# Patient Record
Sex: Female | Born: 1945 | Race: Black or African American | Hispanic: No | Smoking: Former smoker
Health system: Southern US, Community
[De-identification: ages and names within clinical notes are randomized; demographics above are authoritative.]

## PROBLEM LIST (undated history)

## (undated) DIAGNOSIS — E7800 Pure hypercholesterolemia, unspecified: Secondary | ICD-10-CM

## (undated) DIAGNOSIS — I219 Acute myocardial infarction, unspecified: Secondary | ICD-10-CM

## (undated) DIAGNOSIS — I10 Essential (primary) hypertension: Secondary | ICD-10-CM

## (undated) DIAGNOSIS — E119 Type 2 diabetes mellitus without complications: Secondary | ICD-10-CM

## (undated) HISTORY — PX: BACK SURGERY: SHX140

## (undated) HISTORY — DX: Pure hypercholesterolemia, unspecified: E78.00

## (undated) HISTORY — DX: Essential (primary) hypertension: I10

## (undated) HISTORY — PX: ABDOMINAL HYSTERECTOMY: SHX81

## (undated) HISTORY — PX: NECK SURGERY: SHX720

## (undated) HISTORY — DX: Acute myocardial infarction, unspecified: I21.9

## (undated) HISTORY — DX: Type 2 diabetes mellitus without complications: E11.9

---

## 1998-12-25 ENCOUNTER — Encounter: Payer: Self-pay | Admitting: Neurosurgery

## 1998-12-25 ENCOUNTER — Ambulatory Visit (HOSPITAL_COMMUNITY): Admission: RE | Admit: 1998-12-25 | Discharge: 1998-12-25 | Payer: Self-pay | Admitting: Neurosurgery

## 1999-02-19 ENCOUNTER — Encounter: Admission: RE | Admit: 1999-02-19 | Discharge: 1999-03-16 | Payer: Self-pay | Admitting: Anesthesiology

## 2001-07-03 ENCOUNTER — Ambulatory Visit (HOSPITAL_COMMUNITY): Admission: RE | Admit: 2001-07-03 | Discharge: 2001-07-03 | Payer: Self-pay | Admitting: *Deleted

## 2001-07-03 ENCOUNTER — Encounter: Payer: Self-pay | Admitting: *Deleted

## 2001-07-10 ENCOUNTER — Encounter: Payer: Self-pay | Admitting: *Deleted

## 2001-07-10 ENCOUNTER — Ambulatory Visit (HOSPITAL_COMMUNITY): Admission: RE | Admit: 2001-07-10 | Discharge: 2001-07-10 | Payer: Self-pay | Admitting: *Deleted

## 2002-01-06 ENCOUNTER — Encounter (HOSPITAL_COMMUNITY): Admission: RE | Admit: 2002-01-06 | Discharge: 2002-02-05 | Payer: Self-pay | Admitting: Cardiology

## 2002-01-06 ENCOUNTER — Encounter: Payer: Self-pay | Admitting: Cardiology

## 2002-02-12 ENCOUNTER — Ambulatory Visit (HOSPITAL_COMMUNITY): Admission: RE | Admit: 2002-02-12 | Discharge: 2002-02-12 | Payer: Self-pay | Admitting: Internal Medicine

## 2002-03-11 HISTORY — PX: CARDIAC CATHETERIZATION: SHX172

## 2002-08-24 ENCOUNTER — Encounter: Payer: Self-pay | Admitting: *Deleted

## 2002-08-24 ENCOUNTER — Ambulatory Visit (HOSPITAL_COMMUNITY): Admission: RE | Admit: 2002-08-24 | Discharge: 2002-08-24 | Payer: Self-pay | Admitting: *Deleted

## 2002-09-08 ENCOUNTER — Ambulatory Visit (HOSPITAL_COMMUNITY): Admission: RE | Admit: 2002-09-08 | Discharge: 2002-09-08 | Payer: Self-pay | Admitting: *Deleted

## 2002-09-21 ENCOUNTER — Encounter: Payer: Self-pay | Admitting: *Deleted

## 2002-09-21 ENCOUNTER — Ambulatory Visit (HOSPITAL_COMMUNITY): Admission: RE | Admit: 2002-09-21 | Discharge: 2002-09-21 | Payer: Self-pay | Admitting: *Deleted

## 2007-01-25 ENCOUNTER — Emergency Department (HOSPITAL_COMMUNITY): Admission: EM | Admit: 2007-01-25 | Discharge: 2007-01-25 | Payer: Self-pay | Admitting: Emergency Medicine

## 2007-05-31 ENCOUNTER — Inpatient Hospital Stay (HOSPITAL_COMMUNITY): Admission: EM | Admit: 2007-05-31 | Discharge: 2007-06-02 | Payer: Self-pay | Admitting: Emergency Medicine

## 2007-05-31 ENCOUNTER — Ambulatory Visit: Payer: Self-pay | Admitting: Internal Medicine

## 2007-06-18 ENCOUNTER — Ambulatory Visit: Payer: Self-pay | Admitting: *Deleted

## 2009-02-08 ENCOUNTER — Encounter: Payer: Self-pay | Admitting: Gastroenterology

## 2009-02-08 ENCOUNTER — Telehealth (INDEPENDENT_AMBULATORY_CARE_PROVIDER_SITE_OTHER): Payer: Self-pay | Admitting: *Deleted

## 2009-02-10 ENCOUNTER — Encounter: Payer: Self-pay | Admitting: Gastroenterology

## 2009-02-14 ENCOUNTER — Ambulatory Visit: Payer: Self-pay | Admitting: Gastroenterology

## 2009-02-14 ENCOUNTER — Ambulatory Visit (HOSPITAL_COMMUNITY): Admission: RE | Admit: 2009-02-14 | Discharge: 2009-02-14 | Payer: Self-pay | Admitting: Gastroenterology

## 2009-02-14 HISTORY — PX: COLONOSCOPY: SHX174

## 2009-02-27 ENCOUNTER — Encounter: Payer: Self-pay | Admitting: Gastroenterology

## 2009-05-07 IMAGING — CR DG CHEST 2V
2 series · 2 of 2 positions shown · non-contrast
Comparison: 01/25/2007

CLINICAL DATA: Chest pain for several months.   Some pain in the left arm. 
 CHEST - 2 VIEW:

[view not recorded (1 of 2)]
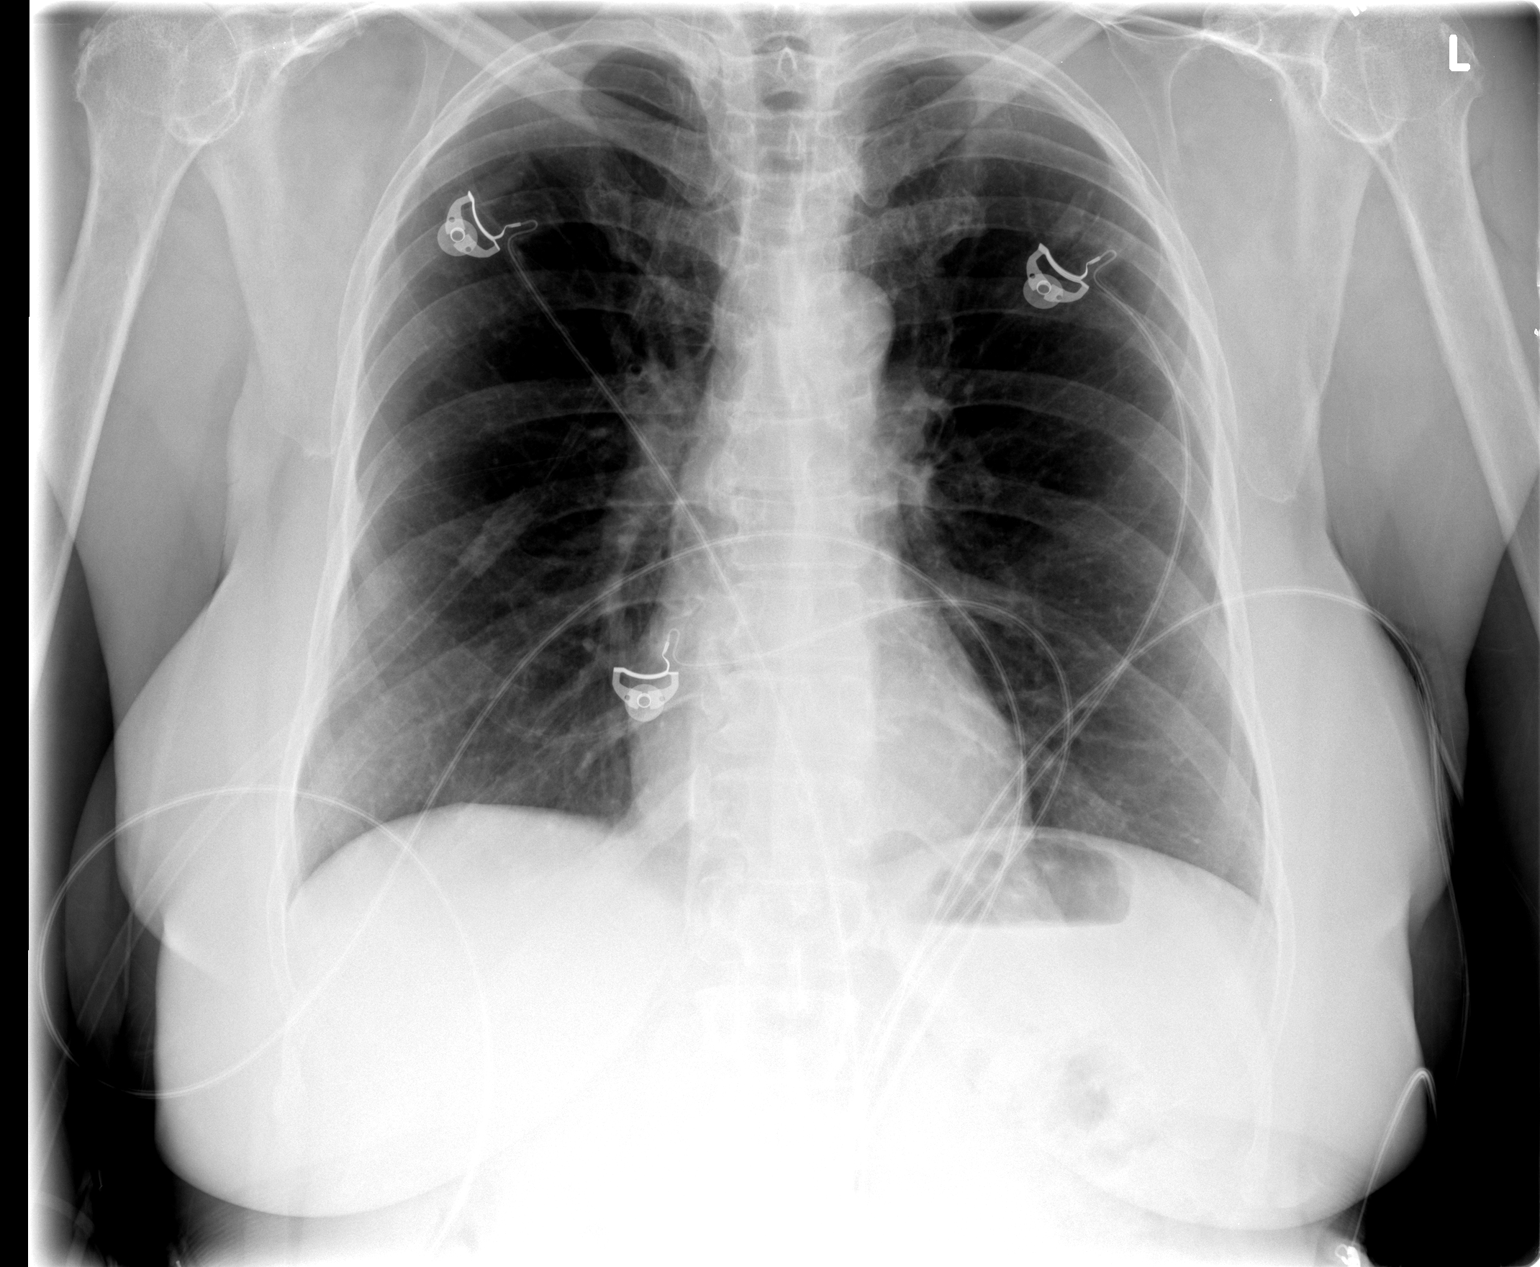

[view not recorded (2 of 2)]
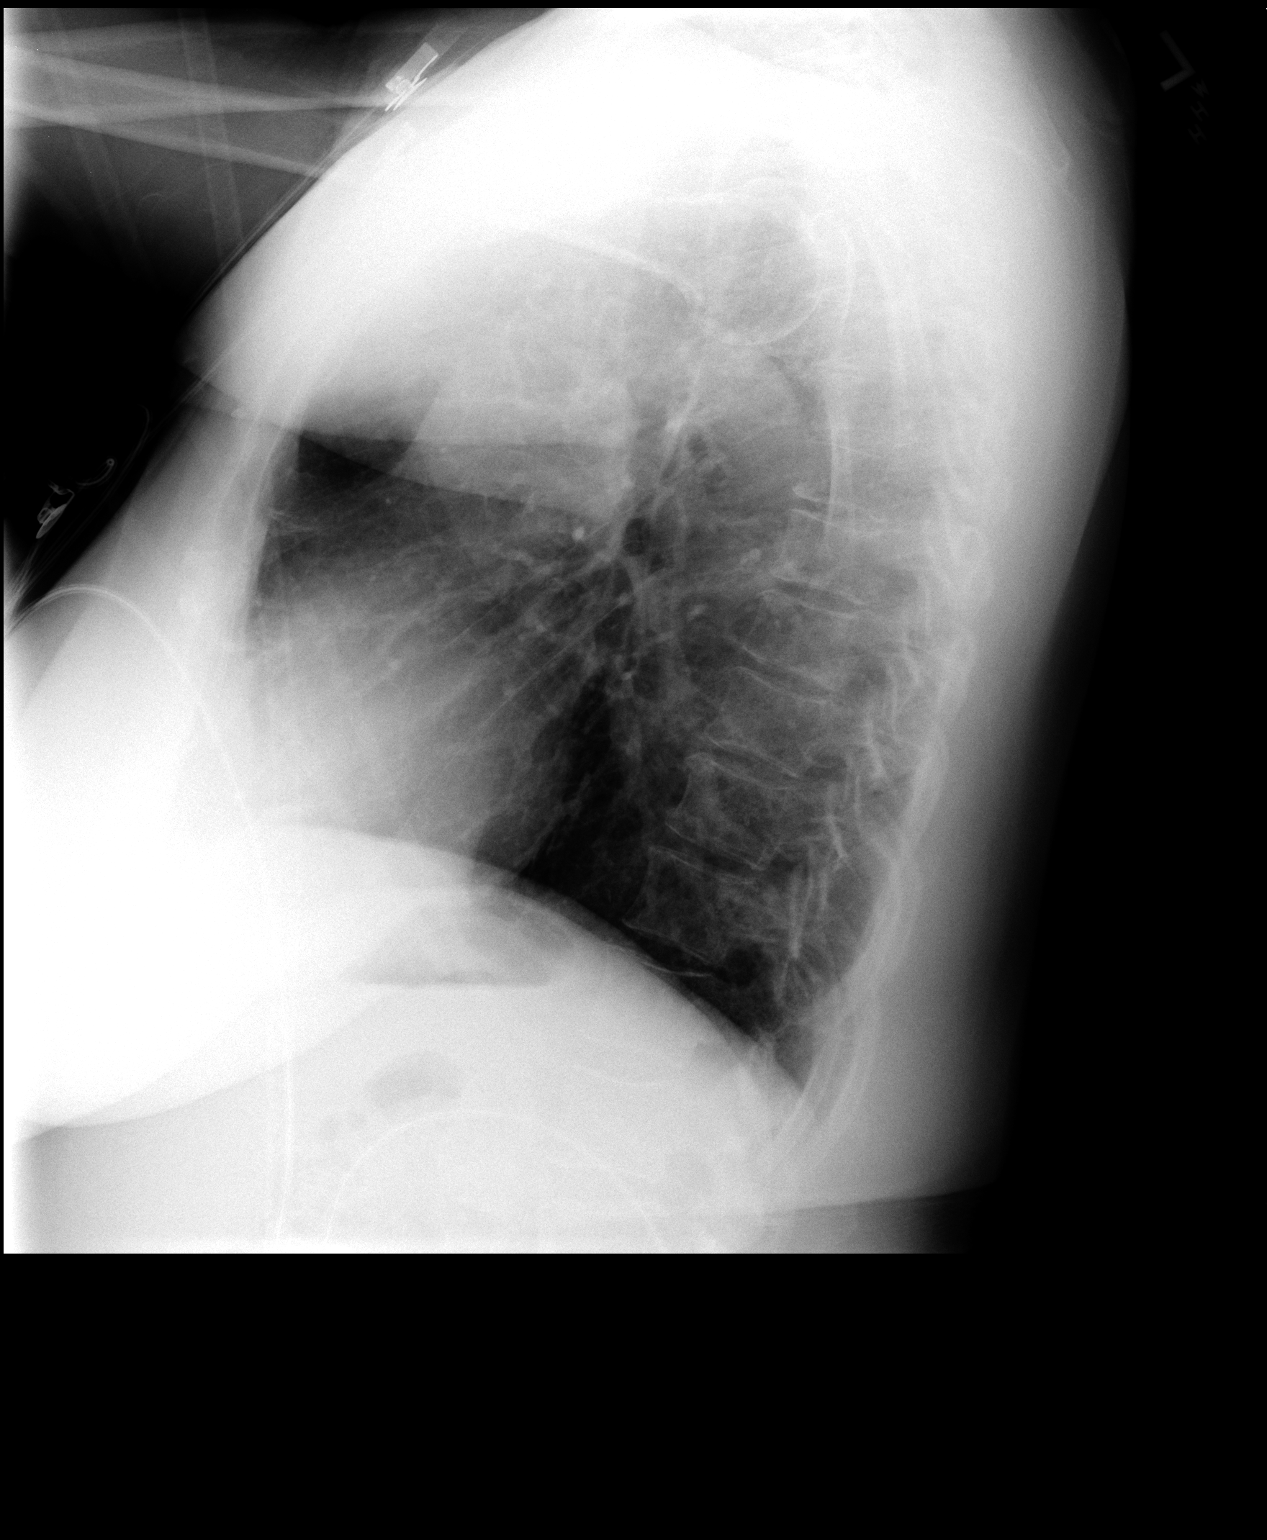

[2 of 2 positions shown; findings below may reference images not displayed]

FINDINGS: Two views of the chest show the lungs to be clear and hyperaerated.  The heart is within normal limits in size. No bony abnormality is seen.
IMPRESSION: Stable chest x-ray.  No active lung disease.

## 2009-05-08 IMAGING — US US EXTREM LOW VENOUS*L*
1 series · 14 of 24 positions shown · non-contrast
Comparison: None

CLINICAL DATA: Pain; ;



[Series 1: unknown · 14 of 26 slices shown]
[im 1/26]
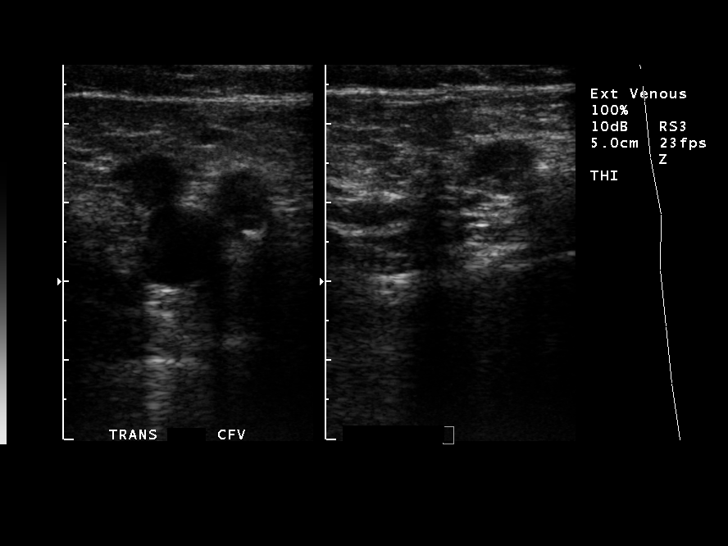
[im 3/26]
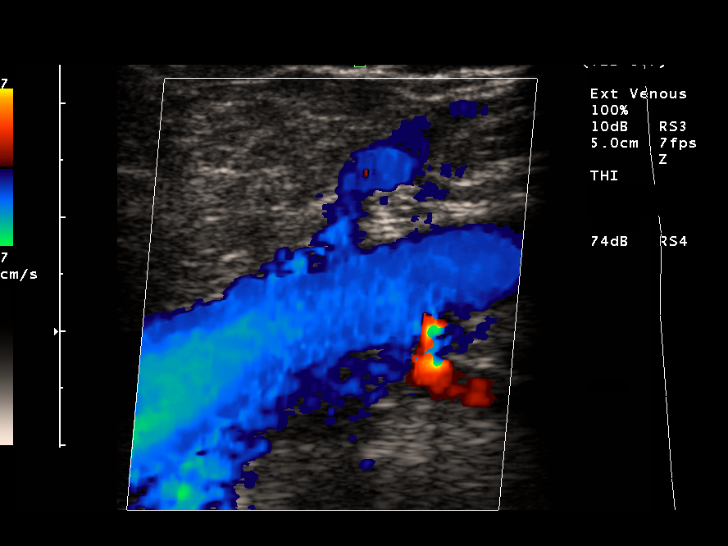
[im 5/26]
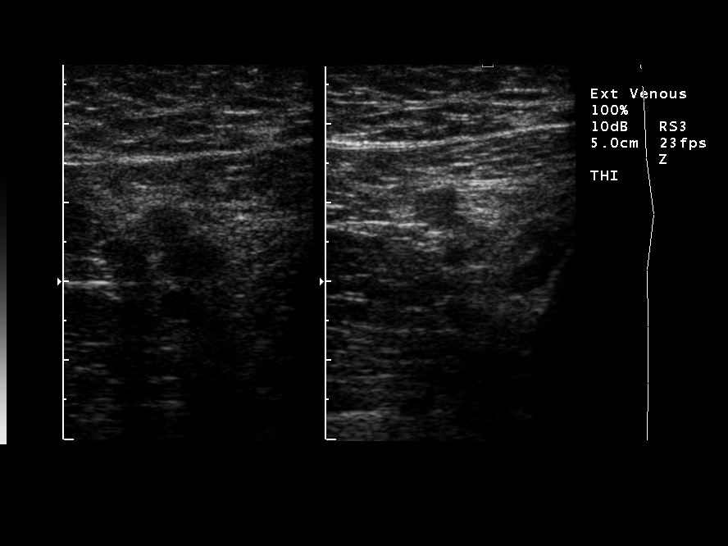
[im 7/26]
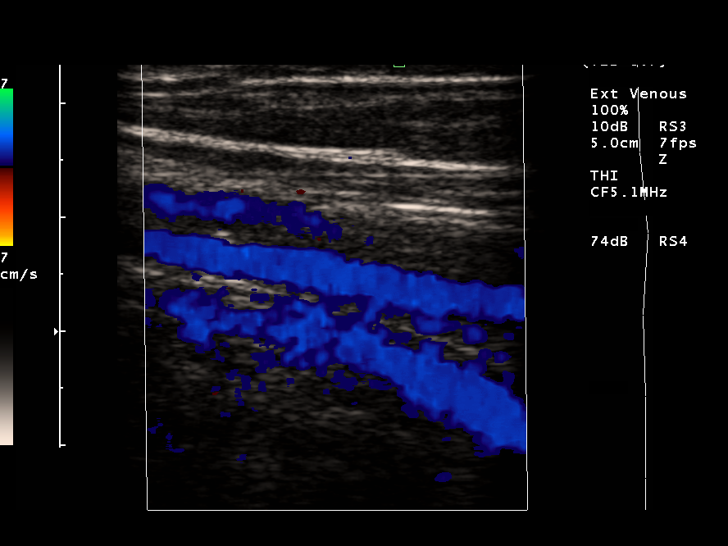
[im 8/26]
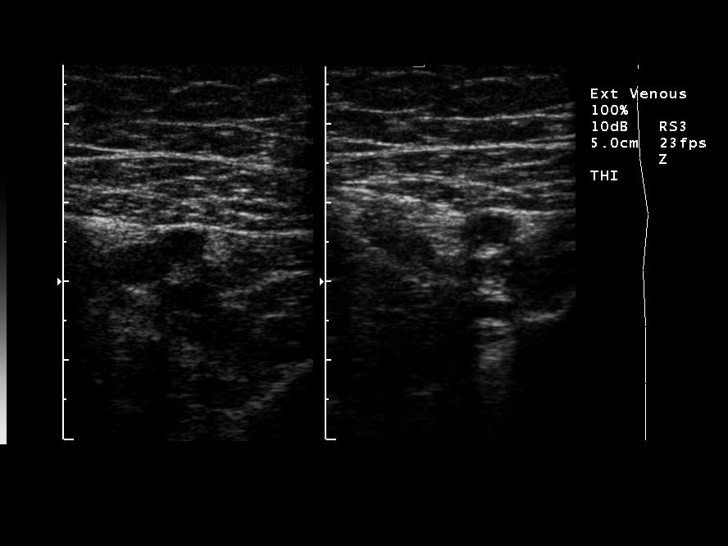
[im 10/26]
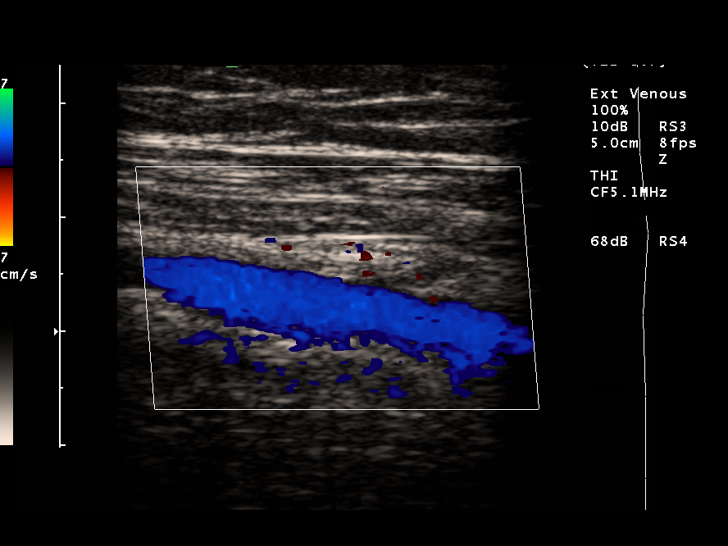
[im 12/26]
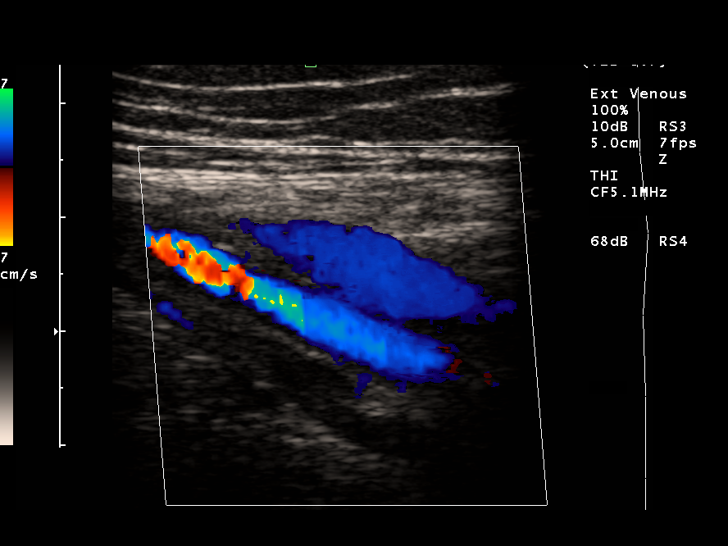
[im 14/26]
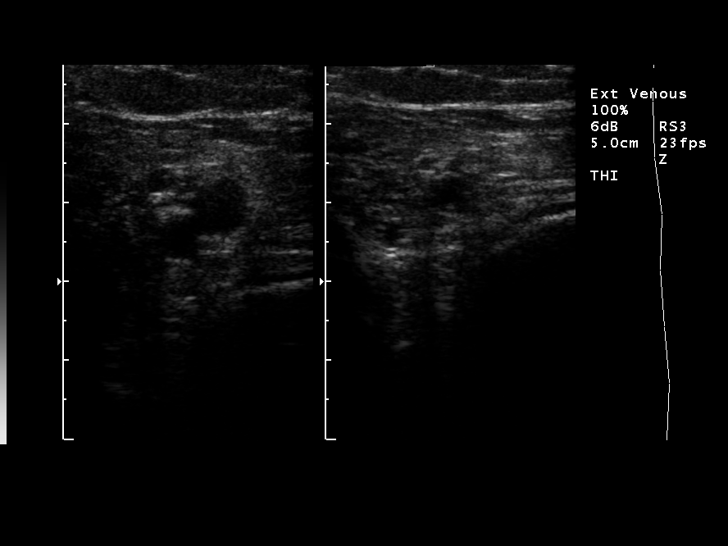
[im 16/26]
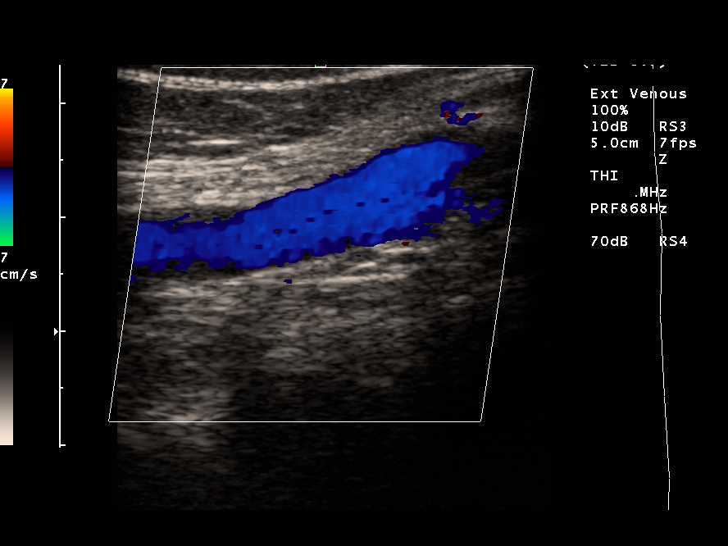
[im 18/26]
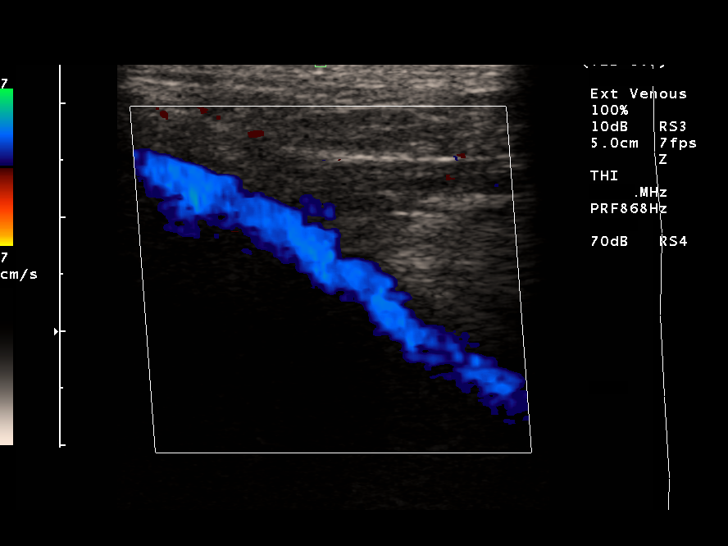
[im 20/26]
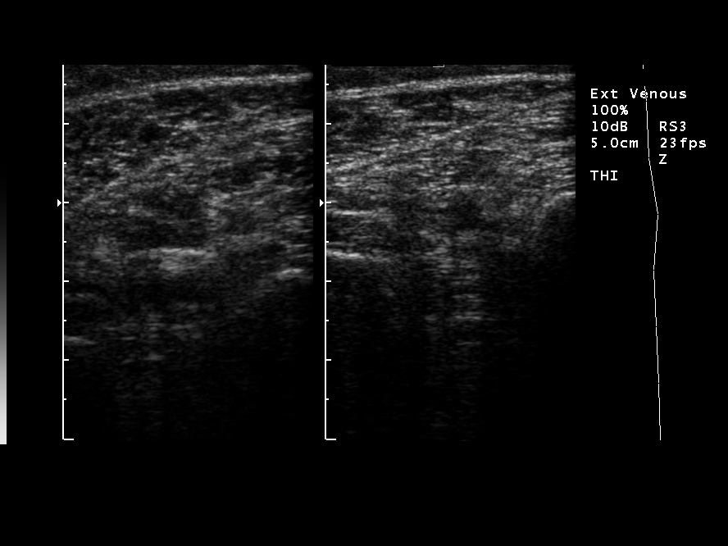
[im 21/26]
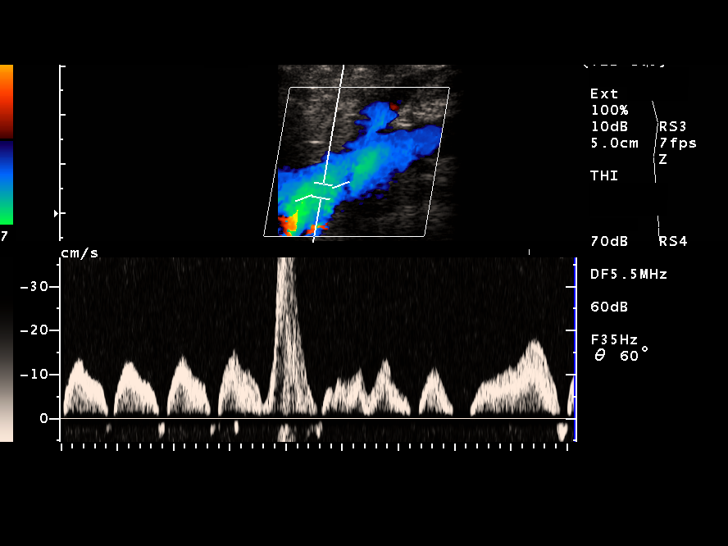
[im 23/26]
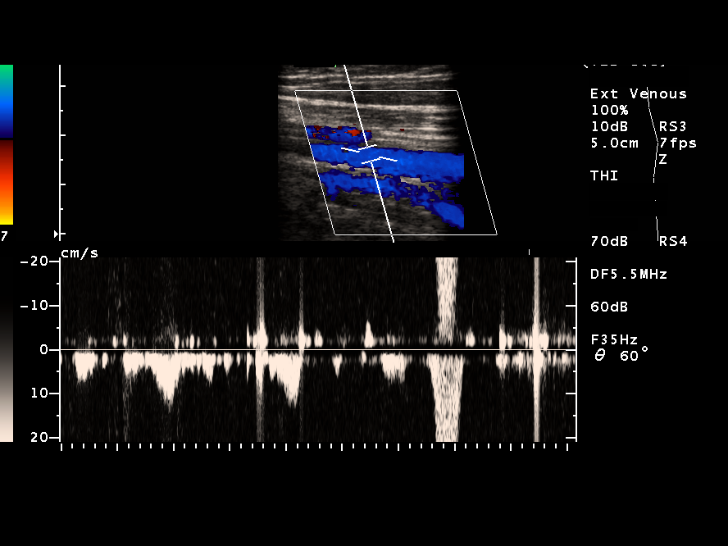
[im 26/26]
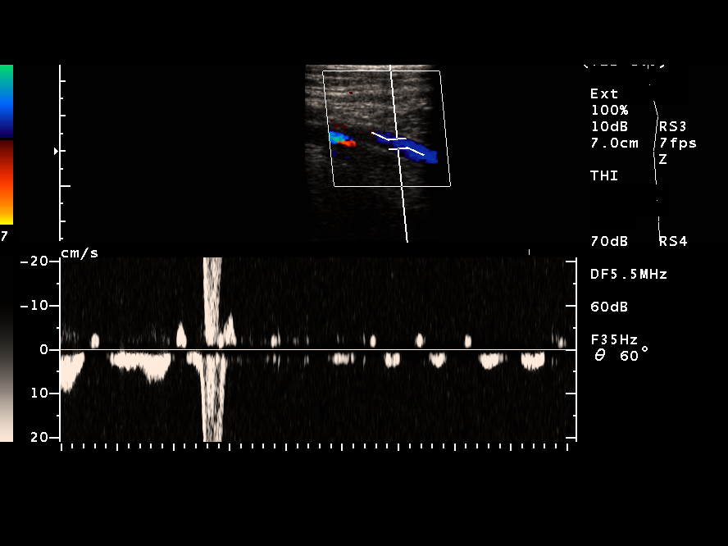

[14 of 24 positions shown; findings below may reference images not displayed]

FINDINGS: Deep venous system patent and compressible from the groin through
popliteal fossa.
Spontaneous venous flow is and with intact augmentation.
No intraluminal thrombus identified.
Venous flow was pulsatile suggesting elevated right heart
pressures..
IMPRESSION: No evidence of deep venous thrombosis.
Question elevated right heart pressure.

## 2010-03-03 ENCOUNTER — Emergency Department (HOSPITAL_COMMUNITY)
Admission: EM | Admit: 2010-03-03 | Discharge: 2010-03-03 | Payer: Self-pay | Source: Home / Self Care | Admitting: Emergency Medicine

## 2010-04-12 NOTE — Letter (Signed)
Summary: Internal Other Montey Hora change for TCS  Internal Other /Prep change for TCS   Imported By: Cloria Spring LPN 84/13/2440 10:27:25  _____________________________________________________________________  External Attachment:    Type:   Image     Comment:   External Document

## 2010-04-12 NOTE — Progress Notes (Signed)
Summary: changing prep  Phone Note Call from Patient   Summary of Call: [name] is scheduled for a TCS on 02/14/09 and she is worried about the prep Rx that was sent to [facility]) costing her $42.00.  She wanted to know if SF could change it to something more affortable for her. You can reach her at [phone] Initial call taken by: Diana Eves,  February 08, 2009 2:14 PM     Appended Document: changing prep Please send Rx for TRILYTE to pharmacy and send pt UTD instructions.  Appended Document: changing prep Pt informed. Called and spoke to [facility], told her not to fill the [facility], and faxed new Rx for [name]. Pt to come by tomorrow, or send someone to get her instructions for her.

## 2010-04-13 NOTE — Letter (Signed)
Summary: Internal Other Domingo Dimes  Internal Other Domingo Dimes   Imported By: Cloria Spring LPN 16/12/9602 54:09:81  _____________________________________________________________________  External Attachment:    Type:   Image     Comment:   External Document

## 2010-05-21 LAB — GLUCOSE, CAPILLARY: Glucose-Capillary: 148 mg/dL — ABNORMAL HIGH (ref 70–99)

## 2010-06-12 LAB — GLUCOSE, CAPILLARY: Glucose-Capillary: 100 mg/dL — ABNORMAL HIGH (ref 70–99)

## 2010-07-24 NOTE — Discharge Summary (Signed)
NAMEKRUTI, HORACEK NO.:  [id number]   MEDICAL RECORD NO.:  [id number]          PATIENT TYPE:  INP   LOCATION:  A213                          FACILITY:  [facility]   PHYSICIAN:  Skeet Latch, DO    DATE OF BIRTH:  [birth year]   DATE OF ADMISSION:  05/31/2007  DATE OF DISCHARGE:  03/24/2009LH                               DISCHARGE SUMMARY   ADDENDUM   Levaquin 250 mg daily for 5 days to her medications.      Skeet Latch, DO  Electronically Signed     SM/MEDQ  D:  06/02/2007  T:  06/03/2007  Job:  [id number]

## 2010-07-24 NOTE — Consult Note (Signed)
VASCULAR SURGERY CONSULTATION   [name], [name]  DOB:  [birth year]                                       06/18/2007  ZOXWR#:[id number]   REASON FOR REFERRAL:  Bilateral leg pain.   Patient is a 65 year old African-American female seen in the office  today with complaints of bilateral leg pain.  This is located in the  posterior thigh, more severe in the left than the right leg.  Typically  occurs in the morning and is not related to activity.  Usually  disappears by the afternoon.   She describes this pain to be worsening since undergoing neck surgery in  1996 and back surgery in 1997 by Dr. Jeral Fruit.   She has taken multiple medications to aid with this pain, including  tramadol, Aleve, and Percocet.   Patient denies calf discomfort with ambulation or foot pain.  No history  of nonhealing lesions.   Risk factors for peripheral vascular disease include a history of  tobacco use, type 2 diabetes, hypertension, and family history.   PAST MEDICAL HISTORY:  1. Hypertension.  2. Type 2 diabetes.  3. Cystitis.  4. Hyperlipidemia.   MEDICATIONS:  1. Glipizide 5 mg daily.  2. Glyburide/Metformin 5/500 1 tablet daily.  3. Metoprolol 100 mg daily.  4. Simvastatin 80 mg daily.  5. Tramadol 50 mg p.r.n.   ALLERGIES:  None known.   SOCIAL HISTORY:  Patient is widowed with one child.  She is on long-term  disability.  Does not currently smoke.  Had smoked up to two packs of  cigarettes daily until November of least year.  No regular alcohol use.   FAMILY HISTORY:  Mother died at age 82 with a history of heart disease  and vascular disease.  She had undergone a lower extremity amputation.  Father died in his 47s with a history of lung CA and heart disease.   REVIEW OF SYSTEMS:  This is reviewed today per patient encounter form.  Patient denies general, cardiac, pulmonary, GI/GU, neurologic,  psychiatric, bleeding, hearing, or visual problems.  She does  note  chronic pain in the legs, as per HPI.  Also arthritic joint discomfort.   PHYSICAL EXAMINATION:  GENERAL:  An alert 65 year old African-American  female in no acute distress.  No cyanosis, pallor, or jaundice.  VITAL SIGNS:  BP 176/86, pulse 66 per minute, respirations 18 per  minute.  HEENT:  Mouth and throat are clear.  Normocephalic.  Extraocular  movements are intact.  NECK:  Supple.  No thyromegaly or adenopathy.  CARDIOVASCULAR:  No carotid bruits.  Normal heart sounds without  murmurs.  No gallops or rubs.  CHEST:  Equal air entry bilaterally without rales or rhonchi.  ABDOMEN:  Soft, nontender.  No masses or organomegaly.  Normal bowel  sounds.  No bruits.  LOWER EXTREMITIES:  Femoral, popliteal, posterior tibial, and dorsalis  pedis pulses 2+.  No ankle edema.  NEUROLOGIC:  Cranial nerves are intact.  Strength is equal bilaterally.  Bilateral reflexes 1+.  SKIN:  Warm, dry, intact.  No ulceration or gangrene.   INVESTIGATIONS:  Lower extremity arterial Doppler evaluation carried  out.  This reveals brisk triphasic arterial waveforms bilaterally with  ankle brachial indices >1.0 bilaterally.  This is a normal lower  extremity arterial Doppler evaluation.   IMPRESSION:  1. Chronic lower extremity pain without evidence  of significant      peripheral vascular disease.  2. Type 2 diabetes.  3. Hypertension.  4. Hyperlipidemia.   RECOMMENDATIONS:  Patient informed of these findings.  No evidence of  significant vascular disease.  Recommend potential followup with Dr.  Jeral Fruit for an evaluation of recurrent back problems.   Balinda Quails, M.D.  Electronically Signed  PGH/MEDQ  D:  06/18/2007  T:  06/18/2007  Job:  [id number]   cc:   Lynnell Chad, PA-C

## 2010-07-24 NOTE — Consult Note (Signed)
[name], HALLETT NO.:  [id number]   MEDICAL RECORD NO.:  [id number]          PATIENT TYPE:  INP   LOCATION:  A213                          FACILITY:  [facility]   PHYSICIAN:  Pricilla Riffle, MD, FACCDATE OF BIRTH:  [birth year]   DATE OF CONSULTATION:  06/01/2007  DATE OF DISCHARGE:                                 CONSULTATION   PRIMARY CARE PHYSICIAN:  Dr. Daneil Dan, [facility].   REFERRING PHYSICIAN:  Skeet Latch, DO   REASON FOR CONSULTATION:  Chest pain.   HISTORY OF PRESENT ILLNESS:  Ms. [name] is a 65 year old, female  patient with a history of single-vessel CAD with a totally occluded RCA  by cardiac catheterization in 2004, who was treated medically at that  time.  She had complained of chest pain and a Cardiolite study was done  that revealed inferior scar.  We have actually not seen the patient in  followup since that visit.  She has had chest pain off and on for the  last couple of years.  This is not related to exertion and there are no  associated symptoms of shortness of breath or radiating pain.  She  presented to [facility] Room yesterday with worsening  chest pain symptoms.  She was more concerned about her symptoms.  She  did take a sublingual nitroglycerin tablet with some relief in her pain.  Again, she described it as a left-sided ache.  She denied any associated  radiating symptoms, shortness of breath or nausea.  She does note  diaphoresis from time to time with her pain.  She denies any syncope or  near-syncope.  Her symptoms only last about 2-3 minutes before  resolving.  She is currently pain-free on a nitroglycerin drip.  Initial  cardiac markers and EKGs have been unremarkable.  We are asked to  further evaluate.   PAST MEDICAL HISTORY:  1. Coronary artery disease as outlined above, treated medically.      Catheterization in June 2004, with RCA 75% proximal stenosis      followed by 95%  mid stenosis, followed by total occlusion in mid      vessel; LAD 25% ostial stenosis, 25% mid stenosis, 25% stenosis at      the second diagonal; circumflex 25% proximal stenosis; left to      right collaterals (LAD and circumflex leading to the PDA).  EF 50%      with inferior akinesis.  2. Diabetes mellitus.  3. Hypertension.  4. Hyperlipidemia.  5. Status post total abdominal hysterectomy with bilateral salpingo-      oophorectomy.  6. Cervical and lumbar degenerative disk disease, status post cervical      and lumbar spine surgery in the past.   MEDICATIONS PRIOR TO ADMISSION:  1. Aspirin 325 mg daily.  2. Glucovance 5/500 mg 2 tablets b.i.d.  3. Tramadol/APAP 50 mg q.6 h. p.r.n.  4. Simvastatin 80 mg nightly.  5. Quinapril 40 mg daily.  6. Metoprolol tartrate 100 mg daily.   ALLERGIES:  No known drug allergies.   SOCIAL HISTORY:  The patient lives in [place].  She has 1 child.  She  quit smoking 4 months ago after a 60 plus pack year history.  She is  disabled secondary to back problems.  She drinks alcohol on occasion.   FAMILY HISTORY:  Significant for diabetes in her mother.  Her mother  died at age 73 from questionable heart problems.  There are no reports  of premature CAD in her family.   REVIEW OF SYSTEMS:  Please see HPI.  She denies any fevers, chills,  headache, rash, dysuria, hematuria, bright red blood per rectum, melena,  dysphagia, odynophagia or skin changes.  She does note chronic left  lower extremity pain.  This seems to be getting worse.  It is worse in  the mornings and does not seem to be related to exertion.  She does note  these symptoms since her last back surgery.  She also notes a  nonproductive cough.  She denies any monocular blindness, unilateral  weakness, difficulty, speech or facial droop.  The rest of the review of  systems are negative.   PHYSICAL EXAMINATION:  GENERAL:  She is a well-nourished, well-developed  female.  VITAL SIGNS:   Blood pressure is 140/89, pulse 76, respirations 22,  temperature 98.4, oxygen saturation 98% on 1 L.  HEENT:  Normal.  NECK:  Without JVD.  LYMPHATICS:  Without lymphadenopathy.  ENDOCRINE:  Without thyromegaly.  CARDIAC:  Normal S1, S2.  Regular rate and rhythm without murmur.  LUNGS:  Decreased breath sounds bilaterally.  No rales.  She does have  faint expiratory wheezes throughout.  SKIN:  Without rash.  ABDOMEN:  Soft, nontender with normoactive bowel sounds.  No  organomegaly.  EXTREMITIES:  Without edema.  She does have notable digital clubbing in  bilateral hands.  MUSCULOSKELETAL:  Without joint deformity.  NEUROLOGIC:  She is alert and oriented x3.  Cranial nerves 2-12 grossly  intact.   LABORATORY DATA AND X-RAY FINDINGS:  Chest x-ray reveals stable chest x-  ray with no acute disease.  EKG reveals normal sinus rhythm with heart  rate of 70, normal axis, no acute changes.   White count 8500, hemoglobin 12.3, hematocrit 35.3, platelet count  327,000.  Sodium 139, potassium 3.5, BUN 8, creatinine 0.64, glucose  170.  Point of care markers negative x2.  Regular markers, first set, CK  60, MB 2.7, troponin I 0.03, INR 0.9.  Urinalysis revealing many  bacteria, positive nitrates, trace leukocyte esterase, 21-50 wbc's, 21-  50 rbc's.   IMPRESSION:  1. Chest pain.  2. Coronary disease with known history of totally occluded right      coronary artery with left and right collaterals by catheterization      in 2004, medical therapy.  3. Ejection fraction 50% by cardiac catheterization in 2004.  4. Diabetes mellitus.  5. Hypertension.  6. Hyperlipidemia.  7. Probable chronic obstructive pulmonary disease, ex-smoker.  8. Degenerative disk disease.      a.     Left lower extremity pain.      b.     Question radicular pain (rule out peripheral artery       disease).  9. Bacteriuria, per primary service.   RECOMMENDATIONS:  The patient presents with chest pain.  Her  symptoms  are atypical.  Her cardiac markers have thus far been negative.  Her EKG  is unremarkable.  We will obtain one more set of cardiac markers.  If  this is negative, we will plan on a stress Myoview study tomorrow  morning, June 02, 2007.  She currently has metoprolol tartrate ordered,  but this is a short-acting formulation.  She is only getting it once a  day and will change it to twice daily dosing at 50 mg b.i.d.  Will  discontinue her nitroglycerin drip and add Norvasc 5 mg daily for  hypertension as well as an antianginal.  Her aspirin and Lovenox will be  continued.  She has had a gallbladder ultrasound and left lower  extremity venous Dopplers performed this morning and these are pending.  Of note, she does have some left lower extremity pain.  Outpatient ABIs  and arterial Dopplers could be considered in the future to rule out  significant PAD contributing to her pain.  Workup and treatment of her  bacteriuria will be per the primary service.   Thank you very much for this consultation.  We will be glad to follow  the patient throughout the remainder of this admission.      Tereso Newcomer, PA-C      Pricilla Riffle, MD, [facility]  Electronically Signed    SW/MEDQ  D:  06/01/2007  T:  06/01/2007  Job:  [id number]   cc:   Daneil Dan, M.D.  [facility]  [place], [place]   Skeet Latch, [place]

## 2010-07-24 NOTE — Discharge Summary (Signed)
[name], [name] NO.:  [id number]   MEDICAL RECORD NO.:  [id number]          PATIENT TYPE:  INP   LOCATION:  A213                          FACILITY:  [facility]   PHYSICIAN:  Skeet Latch, DO    DATE OF BIRTH:  1946-02-26   DATE OF ADMISSION:  05/31/2007  DATE OF DISCHARGE:  03/24/2009LH                               DISCHARGE SUMMARY   DISCHARGE DIAGNOSES:  1. Chest pain.  2. History of coronary artery disease with totally right occluded      coronary artery by catheterization in 2004 with a ejection fraction      of 50%.  3. History of type 2 diabetes.  4. History of hypertension.  5. History of hyperlipidemia.  6. History of chronic obstructive pulmonary disease.  7. Urinary tract infection.   BRIEF HOSPITAL COURSE:  This is a 65 year old African-American female  who presented with chest pain.  The patient described the pain as  achiness feeling that started the evening prior to admission.  The  patient took one nitroglycerine, went back to sleep, the next morning it  was still there.  The patient decided to come to emergency room for  evaluation.  The patient does have a history of coronary artery disease  and had supposedly on MI back in 2004.  She had a cardiac  catheterization, which showed severe single-vessel coronary disease.  Cardiology was consulted.  The patient had a stress Myoview that was  unremarkable.  The patient's Toprol has been decreased.  The patient was  initiated on IV nitroglycerin drip, and this was discontinued and over  the last day, the patient's pain has subsided.  At this time, we feel  the patient is stable enough to be discharged to home.  The patient was  complaining of some mild leg pain during her hospital stay.  She had a  venous ultrasound performed, which showed no evidence of deep venous  thrombosis, did showed some flexible elevated right heart pressure.  The  patient did have abdominal ultrasound performed.  1.  This showed mild intrahepatic biliary dilatation without evidence      of cholelithiasis.  2. Showed probable fatty infiltration of the liver.  3. Tiny left renal cyst.   Her chest x-ray was stable, no active lung disease.   DISCHARGE MEDICATIONS:  Finally, she will be sent home on a following  medications:  1. Aspirin 325 mg daily.  2. Metformin 5/500 mg 2 tablets twice a day.  3. Tramadol acetaminophen q. 6 hours as needed.  4. Simvastatin 80 mg at bedtime.  5. Quinapril 40 mg daily.  6. Metoprolol 50 mg twice a day.   PHYSICAL EXAMINATION:  VITAL SIGNS:  On discharge, temperature is 97.8,  pulse 85, respirations 20, blood pressure 130/71, and she is satting 98%  on room air.   LABORATORY DATA:  Sodium 141, potassium 3.7, chloride 106, CO2 is 29,  glucose 182, BUN 8, and creatinine 0.60.  White count is 9.6, hemoglobin  13.3, hematocrit 38.7, and platelets 374.  Her blood cultures so far are  negative.  She had a TSH of 1.115.  Her last troponin was 0.02.   CONDITION ON DISCHARGE:  Stable.   DISPOSITION:  The patient will be discharged to home.  The patient is to  follow up with her primary care physician in the next 7 days.  Per  Cardiology, we call the patient regarding an appointment.  She is to  maintain on low-sodium and healthy diet.  She is to increase her  activity slowly.  The patient is to return to the emergency room if she  has any advancing severe chest pain or call 911, and the patient is to  take medications as directed.      Skeet Latch, DO  Electronically Signed     SM/MEDQ  D:  06/02/2007  T:  06/03/2007  Job:  [id number]

## 2010-07-24 NOTE — Group Therapy Note (Signed)
NAMERIGBY, SWAMY NO.:  [id number]   MEDICAL RECORD NO.:  [id number]          PATIENT TYPE:  INP   LOCATION:  A213                          FACILITY:  [facility]   PHYSICIAN:  Skeet Latch, DO    DATE OF BIRTH:  [birth year]   DATE OF PROCEDURE:  06/02/2007  DATE OF DISCHARGE:                                 PROGRESS NOTE   SUBJECTIVE:  Ms. Polzin is in the process of having a stress test at  this time.  Family members state that the patient is doing well.  She  has no complaints at this time.   OBJECTIVE:  VITAL SIGNS:  Temperature 98.0, pulse 73, respirations 20,  blood pressure 142/83, saturation 96% on room air.   Unable to get physical exam.  The patient is not in the room.   LABORATORY DATA:  Sodium 141, potassium 3.7, chloride 106, CO2 29,  glucose 182, BUN 8, creatinine 0.60.  White count 92.6, hemoglobin 13.3,  hematocrit 38.7, platelets 374.   ASSESSMENT AND PLAN:  1. Chest pain.  The patient is in the process of getting a stress      Myoview.  Per radiology, stress Myoview is negative.  Anticipate      the patient being discharged to home.  2. Left lower extremity pain.  Her Doppler study was unremarkable.      The patient may need some arterial studies as an outpatient.  3. Diabetes, hypertension.  These seem to be stable.  Continue her      current home medications.   Anticipate discharge very soon.      Skeet Latch, DO  Electronically Signed     SM/MEDQ  D:  06/02/2007  T:  06/02/2007  Job:  [id number]

## 2010-07-24 NOTE — Group Therapy Note (Signed)
NAMECLAUDEEN, [name] NO.:  [id number]   MEDICAL RECORD NO.:  [id number]          PATIENT TYPE:  INP   LOCATION:  A213                          FACILITY:  [facility]   PHYSICIAN:  Skeet Latch, DO    DATE OF BIRTH:  08-09-1945   DATE OF PROCEDURE:  06/01/2007  DATE OF DISCHARGE:                                 PROGRESS NOTE   SUBJECTIVE:  [name] states that her chest pain is improving.  The  patient has no obvious nausea, vomiting, or abdominal pain at this time.  She states that her chest pain has improved.  The patient was seen by  cardiology and appreciate their recommendations at this time.   OBJECTIVE:  VITAL SIGNS:  Temperature 98, pulse 67, respirations 18,  blood pressure 142/78.  She is satting at 98% on room air.  CARDIAC:  Normal S1 and S2.  Regular rate and rhythm.  No murmurs, rubs  or gallops.  LUNGS:  Decreased.  She has some end-expiratory wheezing.  No rales or  rhonchi.  ABDOMEN:  Soft, nontender, nondistended.  Positive bowel sounds.  EXTREMITIES:  No edema.   LABS:  Lipid panel shows a cholesterol of 149, triglycerides 118, HDL  42, LDL 83.  The last troponin was 0.02.  Total creatinine kinase is 51.  CK-MB is 2.2.  PTT is 30.  PT is 12.1.  INR 0.9.  Sodium 139, potassium  3.5, chloride 104, CO2 27, glucose 170, BUN 8, creatinine 0.64.  BNP is  32.5.  White count 8.5, hemoglobin 12.3, hematocrit 35.3, platelets 327.  Her urinalysis did show positive nitrites, trace leukocytes.   ASSESSMENT/PLAN:  1. Chest pain:  The patient has been seen by cardiology.  The plan is      for a stress Myoview tomorrow morning.  The patient's beta blocker      has been adjusted, and Norvasc has been added to her daily      medications.  She will be continued on DVT as well as GI      prophylaxis.  2. Lower extremity pain in her left leg:  I did order a left lower      extremity Doppler.  Results are pending at this time.  It is      recommended that ABIs  and arterial Dopplers be considered if her      Dopplers are unremarkable.  3. Diabetes:  Patient will be maintained on sliding scale.  Her blood      sugars will be checked q.a.c. and nightly.  4. Hypertension:  Patient is on medications.  Beta blocker will be      continued at this time.  Follow closely.   Anticipate patient being discharged very soon if her stress test and  echos are within normal limits.      Skeet Latch, DO  Electronically Signed     SM/MEDQ  D:  06/01/2007  T:  06/01/2007  Job:  [id number]

## 2010-07-24 NOTE — H&P (Signed)
NAMECLYDENE, BURACK NO.:  [id number]   MEDICAL RECORD NO.:  [id number]          PATIENT TYPE:  INP   LOCATION:  A213                          FACILITY:  [facility]   PHYSICIAN:  Skeet Latch, DO    DATE OF BIRTH:  1945/05/25   DATE OF ADMISSION:  05/31/2007  DATE OF DISCHARGE:  LH                              HISTORY & PHYSICAL   CHIEF COMPLAINT:  Chest discomfort.   HISTORY OF PRESENT ILLNESS:  This is a 65 year old African-American  female who presents with some chest discomfort.  The patient describes  he has been having an achiness-type feeling that started last evening  while in bed.  She took 1 nitroglycerin and states that she went back to  sleep.  The patient states that she awoke this morning, still had  achiness, she states 5/10, and after having constant achiness-type pain,  she decided come to the emergency room for evaluation.  The patient  states she has had this uncomfortable achiness in her chest in the past,  and at times she comes to the emergency room; at other times she does  not.  States that she had similar discomfort approximately 3-4 weeks  ago, but the pain subsided.  The patient states she was told she had an  MI back in 2004. She had a cardiac catheterization by Dr. Dorethea Clan that  showed severe single-vessel coronary disease, likely secondary to old  myocardial infarction.  The vessel appeared not to be amenable to  angioplasty and no evidence of significant ischemia on perfusion study;  so she was treated medically with aggressive risk factor modification  and anti-anginal therapy.  The patient states that she has not seen a  cardiologist since 2004.   PAST MEDICAL HISTORY:  1. Hypertension.  2. Diabetes.  3. High cholesterol.  4. Myocardial infarction in 2004.   SURGICAL HISTORY:  1. Back surgery.  2. Hysterectomy.  3. Neck surgery.   SOCIAL HISTORY:  Denies any alcohol, no drug abuse, quit smoking  approximately 4 months ago.  She was a two-pack-per-day smoker for over  25 years.   ALLERGIES:  No known drug allergies.   HOME MEDICATIONS:  1. Glucovance 5/500 mg 2 tablets twice a day.  2. tramadol/acetaminophen 50 mg every 6 hours as needed.  3. Simvastatin 80 mg at bedtime.  4. Quinapril 40 mg once a day.  5. Metoprolol tartrate 100 mg once a day.   REVIEW OF SYSTEMS:  GENERAL:  No fever, chills, change in appetite.  HEENT: Unremarkable.  CARDIOVASCULAR:  Positive for some chest pain.  No  palpitations.  RESPIRATORY:  Slight cough.  No shortness of breath or  wheezing.  GASTROINTESTINAL: No nausea, vomiting, diarrhea, abdominal  pain.  GENITOURINARY:  No dysuria or frequency.  MUSCULOSKELETAL:  Arthralgias, myalgias.  SKIN:  No rashes or pruritus.  NEUROLOGIC:  No  headache or weakness. Other systems are negative.   PHYSICAL EXAMINATION:  VITAL SIGNS:  Temperature 98.1, pulse 69,  respirations 16, blood pressure 149/70.  GENERAL:  She is well developed, well hydrated, well nourished, no acute  distress, awake and alert.  HEENT:  Head is atraumatic, normocephalic.  Eyes PERRLA.  EOMI.  NECK:  Soft, supple, nontender, nondistended.  CARDIOVASCULAR:  Regular rate and rhythm.  No murmurs, rubs or gallops.  LUNGS:  Clear to auscultation bilaterally.  Slight end-expiratory  wheezing noted.  No rhonchi or rales. She does have some slight  tenderness in the left substernal region to palpation.  ABDOMEN:  Soft, nontender, nondistended.  Positive bowel sounds.  No  rigidity or guarding.  EXTREMITIES:  No clubbing, cyanosis or erythema.  NEUROLOGIC:  Cranial nerves II-XII grossly intact.  Patient alert and  oriented x3.  SKIN:  No rashes or pruritus is noted.   EKG showed normal sinus rhythm, normal axis, no ST-T changes, 72 beats  per minute.   Chest x-ray showed no acute changes.   Labs:  Her last troponin was less than 0.05. Sodium 135, potassium 3.5,  chloride 102, CO2 24, glucose 229, BUN 9, creatinine  0.71.  White count  10.2, hemoglobin 14, hematocrit 40.6, platelets 410.   ASSESSMENT:  1. Chest pain.  2. History of hypertension.  3. History of diabetes.  4. History of hyperlipidemia.  5. History of previous of coronary artery disease.   PLAN:  1. The patient will be admitted to [facility] on a telemetry unit.  2. We will get a cardiology consult in the morning.  3. The patient presented with elevated blood pressure and was placed      on nitroglycerin drip which I will continue at this time as      patient's chest discomfort is waxing and waning.  4. The patient will placed on IV pain medications and oxygen via nasal      cannula at this time.  5. Will give one more set of cardiac enzymes and EKG in the morning.  6. For hyperlipidemia, we will get a lipid panel in the morning and      will continue the patient her home on statin.  7. For hypertension, we will place the patient on home medications      which include beta-blocker and ACE inhibitor. The patient is      continued on nitroglycerin drip also.  8. The patient be placed on DVT as well as GI prophylaxis.      Skeet Latch, DO  Electronically Signed     SM/MEDQ  D:  05/31/2007  T:  05/31/2007  Job:  [id number]

## 2010-07-27 NOTE — Cardiovascular Report (Signed)
NAME:  [name], [name] NO.:  [id number]   MEDICAL RECORD NO.:  [id number]                   PATIENT TYPE:  OIB   LOCATION:  2857                                 FACILITY:  [facility]   PHYSICIAN:  Clyde Lundborg, M.D.            DATE OF BIRTH:  01-Mar-1946   DATE OF PROCEDURE:  09/08/2002  DATE OF DISCHARGE:                              CARDIAC CATHETERIZATION   PRIMARY CARE PHYSICIAN:  Colan Neptune, M.D., [facility]   PROCEDURES PERFORMED:  1. Left heart catheterization.  2. Selected coronary angiography.  3. Left ventriculography.   CARDIOLOGIST:  Farris Has. Dorethea Clan, M.D.   INDICATIONS FOR PROCEDURE:  [name] is a 65 year old female who presents  to me with chest discomfort concerning for angina, underwent a perfusion  imaging study which showed inferior scar with evidence of mildly depressed  LV function and she was referred for elective heart catheterization.  I  discussed with her the various approaches and she has chosen the right  radial approach.   DETAILS OF THE PROCEDURE:  After obtaining informed consent, the patient was  brought to the Cardiac Catheterization Laboratory in a fasting state.  There  she was prepped and draped in the usual sterile manner, and the right wrist  was anesthetized using 1% lidocaine without epinephrine.  The right radial  artery was cannulated using the modified Seldinger technique with a  5-  French 10 cm sheath and left heart catheterization was performed using a 5-  [place] Judkins left #3.5, a 5-French Judkins right #4.0, and a 5-French  pigtail catheter.  The pigtail catheter was used for ventriculography which  was imaged in a 30 degree RAO view.   At the conclusion of the procedures, the catheters were removed, hemostasis  was obtained after the sheath was pulled with a RadStat device.  Distal  circulation was validated using a pulse oximetry of both the thumb and the  third digit,  and then the patient was moved back to the Cardiology holding  area.   RESULTS:  1. Aortic pressure was measured at 140/75 with a mean arterial pressure of     101.  2. Left ventricular pressure 132/12 with an end-diastolic pressure of 18     mmHg.  3. Selective coronary angiography:  The left main is a moderate caliber     vessel with only luminal irregularities.  4. The left anterior descending coronary artery is a moderate caliber     transapical vessel, which has evidence of a 25% stenosis in its ostial     portion, a 25% stenosis in the mid portion between the first and second     diagonal, and a 25% stenosis after the second diagonal.  5. The circumflex coronary artery is a moderate caliber vessel, which has a     25% stenosis in its proximal portion, has a large bifurcating obtuse     marginal, and there is right to  left collateralization via both the LAD     and the circumflex to the posterior descending coronary artery.  6. The right coronary artery is a moderate caliber dominant vessel, which is     severely diseased.  It is occluded in its mid portion just prior to the     crux, there is a 95% lesion in the mid portion as well as a 75% lesion in     the proximal portion.  7. The posterior descending coronary artery appears to be a moderate caliber     vessel and is seen to fill via collaterals from the left injections.  8. The left ventriculography reveals mildly depressed ejection fraction at     50% with inferior akinesis from the base to the mid ventricle     corresponding to the distribution of the posterior descending coronary     artery.  There is no mitral regurgitation seen.   ASSESSMENT:  Our assessment is this is a woman with severe single-vessel  coronary disease likely secondary to an old myocardial infarction.  This  vessel appears not to be amenable to angioplasty.  There was no evidence of  significant ischemia on perfusion study, so I am going to treat this  woman  medically with aggressive risk factor modification and anti-anginal therapy.  I discussed her case with her primary care physician, Dr. Laveda Abbe, who  agrees with my assessment.                                               Clyde Lundborg, M.D.    JMH/MEDQ  D:  09/08/2002  T:  09/08/2002  Job:  [id number]

## 2010-07-27 NOTE — Procedures (Signed)
   NAME:  [name], HAND NO.:  [id number]   MEDICAL RECORD NO.:  [id number]                   PATIENT TYPE:  OUT   LOCATION:  RAD                                  FACILITY:  [facility]   PHYSICIAN:  Rutland Bing, M.D. [facility]           DATE OF BIRTH:  29-Jul-1945   DATE OF PROCEDURE:                              AGE:  65  DATE OF DISCHARGE:                              SEX:  F                                CARDIAC ULTRASOUND   REFERRING PHYSICIANS:  Dr. Reinaldo Raddle and Dr. Tenny Craw.   CLINICAL INFORMATION:  A 65 year old woman with chest pain, hypertension,  diabetes, and an abnormal cardiac functional study.   M-MODE:  AORTA:  2.5  (<4.0)  LEFT ATRIUM:  3.6 (<4.0)  SEPTUM:  1.4  (0.7-1.1)  POSTERIOR WALL:  1.1 (0.7-1.1)  LV-DIASTOLE:  4.0 (<5.7)  LV-SYSTOLE:  3.3  (<4.0)   FINDINGS/IMPRESSION:  1. Technically adequate echocardiographic study.  2. Slight left atrial enlargement; normal right atrium and right ventricle.  3. Slight mitral valve thickening with mild annular calcification and     trivial regurgitation.  4. Normal tricuspid and pulmonic valves.  5. Minimal aortic valvular sclerosis.  6. Normal internal dimension of the left ventricle; borderline LVH, most     prominently noted in the proximal septum.  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  Overall Left ventricular systolic function is normal     with an estimated ejection fraction of 0.55-0.60.  7. Normal Doppler examination.                                                Bing, M.D. [facility]    RR/MEDQ  D:  02/12/2002  T:  02/12/2002  Job:  [id number]

## 2010-07-27 NOTE — Procedures (Signed)
   NAME:  [name], BOGGESS NO.:  [id number]   MEDICAL RECORD NO.:  [id number]                  PATIENT TYPE:   LOCATION:                                       FACILITY:  [facility]   PHYSICIAN:  Pricilla Riffle, M.D. [facility]             DATE OF BIRTH:   DATE OF PROCEDURE:  01/06/2002  DATE OF DISCHARGE:                                    STRESS TEST   STRESS CARDIOLITE:   INDICATION:  The patient is a 65 year old black female with history of chest  pressure and left arm pain at night.  She also has diabetes, hyperlipidemia  and is a smoker.   TEST:  EKG normal sinus rhythm.  The patient exercised 6 minutes 5 seconds  per Bruce protocol obtaining a heart rate of 134, target heart rate was 141.  Test was stopped due to leg/back of the neck pain and shortness of breath.  She had no chest pain or EKG changes.  She did have poor exercise tolerance.  Cardiolite images are to follow.     Janan Ridge, P.A.-C.                   Pricilla Riffle, M.D. [facility]    ML/MEDQ  D:  01/06/2002  T:  01/07/2002  Job:  [id number]

## 2010-12-03 LAB — BASIC METABOLIC PANEL
BUN: 8
BUN: 8
BUN: 9
CO2: 24
CO2: 27
CO2: 29
Calcium: 8.8
Calcium: 9.1
Calcium: 9.3
Chloride: 102
Chloride: 104
Chloride: 106
Creatinine, Ser: 0.6
Creatinine, Ser: 0.64
Creatinine, Ser: 0.71
GFR calc Af Amer: >60
GFR calc Af Amer: >60
GFR calc Af Amer: >60
GFR calc non Af Amer: >60
GFR calc non Af Amer: >60
GFR calc non Af Amer: >60
Glucose, Bld: 170 — ABNORMAL HIGH
Glucose, Bld: 182 — ABNORMAL HIGH
Glucose, Bld: 229 — ABNORMAL HIGH
Potassium: 3.5
Potassium: 3.5
Potassium: 3.7
Sodium: 135
Sodium: 139
Sodium: 141

## 2010-12-03 LAB — DIFFERENTIAL
Basophils Absolute: 0
Basophils Absolute: 0
Basophils Absolute: 0
Basophils Relative: 0
Basophils Relative: 0
Basophils Relative: 0
Eosinophils Absolute: 0.3
Eosinophils Absolute: 0.3
Eosinophils Absolute: 0.4
Eosinophils Relative: 3
Eosinophils Relative: 3
Eosinophils Relative: 5
Lymphocytes Relative: 28
Lymphocytes Relative: 31
Lymphocytes Relative: 49 — ABNORMAL HIGH
Lymphs Abs: 2.9
Lymphs Abs: 3
Lymphs Abs: 4.1 — ABNORMAL HIGH
Monocytes Absolute: 0.5
Monocytes Absolute: 0.6
Monocytes Absolute: 0.7
Monocytes Relative: 6
Monocytes Relative: 6
Monocytes Relative: 7
Neutro Abs: 3.4
Neutro Abs: 5.6
Neutro Abs: 6.3
Neutrophils Relative %: 40 — ABNORMAL LOW
Neutrophils Relative %: 59
Neutrophils Relative %: 62

## 2010-12-03 LAB — CULTURE, BLOOD (ROUTINE X 2)
Culture: NO GROWTH
Report Status: 3282009

## 2010-12-03 LAB — CBC
HCT: 35.3 — ABNORMAL LOW
HCT: 38.7
HCT: 40.6
Hemoglobin: 12.3
Hemoglobin: 13.3
Hemoglobin: 14
MCHC: 34.4
MCHC: 34.4
MCHC: 35
MCV: 93.8
MCV: 93.9
MCV: 94.1
Platelets: 327
Platelets: 374
Platelets: 410 — ABNORMAL HIGH
RBC: 3.75 — ABNORMAL LOW
RBC: 4.12
RBC: 4.33
RDW: 13.4
RDW: 13.5
RDW: 13.7
WBC: 10.2
WBC: 8.5
WBC: 9.6

## 2010-12-03 LAB — CARDIAC PANEL(CRET KIN+CKTOT+MB+TROPI)
CK, MB: 2.7
Relative Index: INVALID
Total CK: 60
Troponin I: 0.03

## 2010-12-03 LAB — URINALYSIS, ROUTINE W REFLEX MICROSCOPIC
Bilirubin Urine: NEGATIVE
Glucose, UA: NEGATIVE
Ketones, ur: NEGATIVE
Nitrite: POSITIVE — AB
Protein, ur: NEGATIVE
Specific Gravity, Urine: >1.03 — ABNORMAL HIGH
Urobilinogen, UA: 0.2
pH: 5.5

## 2010-12-03 LAB — POCT CARDIAC MARKERS
CKMB, poc: 1.2
CKMB, poc: 1.6
Myoglobin, poc: 39.7
Myoglobin, poc: 40
Operator id: 132501
Operator id: 205141
Troponin i, poc: <0.05
Troponin i, poc: <0.05

## 2010-12-03 LAB — URINE MICROSCOPIC-ADD ON

## 2010-12-03 LAB — LIPID PANEL
Cholesterol: 149
HDL: 42
LDL Cholesterol: 83
Total CHOL/HDL Ratio: 3.5
Triglycerides: 118
VLDL: 24

## 2010-12-03 LAB — PROTIME-INR
INR: 0.9
Prothrombin Time: 12.1

## 2010-12-03 LAB — CK TOTAL AND CKMB (NOT AT ARMC)
CK, MB: 2.2
Relative Index: INVALID
Total CK: 51

## 2010-12-03 LAB — TROPONIN I: Troponin I: 0.02

## 2010-12-03 LAB — B-NATRIURETIC PEPTIDE (CONVERTED LAB): Pro B Natriuretic peptide (BNP): 32.5

## 2010-12-03 LAB — APTT: aPTT: 30

## 2010-12-03 LAB — TSH
TSH: 1.115
TSH: 1.135

## 2010-12-18 LAB — URINALYSIS, ROUTINE W REFLEX MICROSCOPIC
Glucose, UA: NEGATIVE
Ketones, ur: NEGATIVE
Leukocytes, UA: NEGATIVE
Nitrite: NEGATIVE
Protein, ur: 100 — AB
Specific Gravity, Urine: 1.01
Urobilinogen, UA: 2 — ABNORMAL HIGH
pH: 7

## 2010-12-18 LAB — DIFFERENTIAL
Basophils Absolute: 0
Basophils Relative: 0
Eosinophils Absolute: 0 — ABNORMAL LOW
Eosinophils Relative: 0
Lymphocytes Relative: 11 — ABNORMAL LOW
Lymphs Abs: 1.5
Monocytes Absolute: 1.5 — ABNORMAL HIGH
Monocytes Relative: 11
Neutro Abs: 11 — ABNORMAL HIGH
Neutrophils Relative %: 79 — ABNORMAL HIGH

## 2010-12-18 LAB — CULTURE, BLOOD (ROUTINE X 2)
Culture: NO GROWTH
Report Status: 11212008

## 2010-12-18 LAB — CBC
HCT: 35.9 — ABNORMAL LOW
Hemoglobin: 12.1
MCHC: 33.9
MCV: 93
Platelets: 299
RBC: 3.86 — ABNORMAL LOW
RDW: 13.8
WBC: 13.9 — ABNORMAL HIGH

## 2010-12-18 LAB — URINE CULTURE: Colony Count: >=100000

## 2010-12-18 LAB — BASIC METABOLIC PANEL
BUN: 4 — ABNORMAL LOW
CO2: 26
Calcium: 8.6
Chloride: 101
Creatinine, Ser: 0.58
GFR calc Af Amer: >60
GFR calc non Af Amer: >60
Glucose, Bld: 115 — ABNORMAL HIGH
Potassium: 3.2 — ABNORMAL LOW
Sodium: 135

## 2010-12-18 LAB — URINE MICROSCOPIC-ADD ON

## 2011-11-15 ENCOUNTER — Other Ambulatory Visit (HOSPITAL_COMMUNITY): Payer: Self-pay | Admitting: Family Medicine

## 2011-11-15 DIAGNOSIS — Z139 Encounter for screening, unspecified: Secondary | ICD-10-CM

## 2011-11-19 ENCOUNTER — Ambulatory Visit (HOSPITAL_COMMUNITY)
Admission: RE | Admit: 2011-11-19 | Discharge: 2011-11-19 | Disposition: A | Discharge status: Home / Self Care | Payer: Medicare Other | Source: Ambulatory Visit | Attending: Family Medicine | Admitting: Family Medicine

## 2011-11-19 DIAGNOSIS — Z1231 Encounter for screening mammogram for malignant neoplasm of breast: Secondary | ICD-10-CM | POA: Insufficient documentation

## 2011-11-19 DIAGNOSIS — Z139 Encounter for screening, unspecified: Secondary | ICD-10-CM

## 2012-07-20 ENCOUNTER — Encounter: Payer: Self-pay | Admitting: Gastroenterology

## 2012-07-21 ENCOUNTER — Encounter: Payer: Self-pay | Admitting: Gastroenterology

## 2012-07-21 ENCOUNTER — Ambulatory Visit (INDEPENDENT_AMBULATORY_CARE_PROVIDER_SITE_OTHER): Payer: Medicare Other | Admitting: Gastroenterology

## 2012-07-21 VITALS — BP 119/74 | HR 100 | Temp 97.8°F | Ht 60.0 in | Wt 129.0 lb

## 2012-07-21 DIAGNOSIS — D649 Anemia, unspecified: Secondary | ICD-10-CM

## 2012-07-21 DIAGNOSIS — R110 Nausea: Secondary | ICD-10-CM

## 2012-07-21 DIAGNOSIS — R634 Abnormal weight loss: Secondary | ICD-10-CM

## 2012-07-21 NOTE — Progress Notes (Unsigned)
According to referral, she was sent to [place] regarding anemia. We requested labs, and I received a UA from PCP. Please have office fax any CBC, anemia panel they may have.  They should have this on file, as it prompted the referral.

## 2012-07-21 NOTE — Patient Instructions (Addendum)
We will review blood work and get back with you shortly! Most likely we will need to update your colonoscopy and pursue an upper endoscopy.   We will be in touch soon!

## 2012-07-22 ENCOUNTER — Encounter: Payer: Self-pay | Admitting: Gastroenterology

## 2012-07-22 DIAGNOSIS — R110 Nausea: Secondary | ICD-10-CM | POA: Insufficient documentation

## 2012-07-22 DIAGNOSIS — D649 Anemia, unspecified: Secondary | ICD-10-CM | POA: Insufficient documentation

## 2012-07-22 DIAGNOSIS — R634 Abnormal weight loss: Secondary | ICD-10-CM | POA: Insufficient documentation

## 2012-07-22 NOTE — Assessment & Plan Note (Signed)
Per referral request. No labs to review at this time. Since office visit, we have requested a CBC multiple times. Unable to retrieve. Will go ahead and recheck CBC, iron, ferritin. Anticipate TCS/EGD in near future.

## 2012-07-22 NOTE — Progress Notes (Signed)
Primary Care Physician:  Amber John, MD Primary Gastroenterologist:  Dr. Darrick Penna   Chief Complaint  Patient presents with  . Anemia    HPI:   [name] is a very pleasant 67 year old female presenting today as a referral secondary to anemia. At time of consultation, no blood work was included with her referring information. We have requested several times the actual blood work indicating anemia, but this was to no avail. Both the medical records representative and one of the nurses here have spoken to PCP's office. No CBC has been found. She returns stating she has lost weight, with her normal weight in the 140s.Today 129.  She attributes this to difficulty with her bottom dentures. No abdominal pain, but she does report nausea, which she also believes is secondary to dentures. States she "sucks" on her bottom plate to keep in place. Symptoms X 5 months. No GERD or dysphagia. No constipation, diarrhea, melena, hematochezia. Poor appetite, eats mainly 2 meals per day. No early satiety.   Past Medical History  Diagnosis Date  . Hypertension   . Diabetes   . High cholesterol   . Myocardial infarction 2004.    Past Surgical History  Procedure Laterality Date  . Colonoscopy  02/14/09    [id number] colon polyp/small internal hemorrhoids, benign polyp  . Back surgery    . Abdominal hysterectomy    . Neck surgery      Current Outpatient Prescriptions  Medication Sig Dispense Refill  . aspirin 81 MG tablet Take 81 mg by mouth daily.      Marland Kitchen glipiZIDE-metformin (METAGLIP) 5-500 MG per tablet Take 1 tablet by mouth 2 (two) times daily before a meal.       . hydrochlorothiazide (HYDRODIURIL) 25 MG tablet Take 25 mg by mouth daily.       . quinapril (ACCUPRIL) 40 MG tablet Take 40 mg by mouth daily.        No current facility-administered medications for this visit.    Allergies as of 07/21/2012  . (No Known Allergies)    Family History  Problem Relation Age of Onset  . Colon  cancer Neg Hx     History   Social History  . Marital Status: Widowed    Spouse Name: N/A    Number of Children: N/A  . Years of Education: N/A   Occupational History  . Not on file.   Social History Main Topics  . Smoking status: Never Smoker   . Smokeless tobacco: Not on file  . Alcohol Use: No  . Drug Use: No  . Sexually Active: Not on file   Other Topics Concern  . Not on file   Social History Narrative  . No narrative on file    Review of Systems: Gen: +fatigue CV: Denies chest pain, heart palpitations, peripheral edema, syncope.  Resp: Denies shortness of breath at rest or with exertion. Denies wheezing or cough.  GI: Denies dysphagia or odynophagia. Denies jaundice, hematemesis, fecal incontinence. GU : Denies urinary burning, urinary frequency, urinary hesitancy MS: +lower back pain  Derm: Denies rash, itching, dry skin Psych: Denies depression, anxiety, memory loss, and confusion Heme: Denies bruising, bleeding, and enlarged lymph nodes.  Physical Exam: BP 119/74  Pulse 100  Temp(Src) 97.8 F (36.6 C) (Oral)  Ht 5' (1.524 m)  Wt 129 lb (58.514 kg)  BMI 25.19 kg/m2 General:   Alert and oriented. Pleasant and cooperative. Well-nourished and well-developed.  Head:  Normocephalic and atraumatic. Eyes:  Without icterus,  sclera clear and conjunctiva pink.  Ears:  Normal auditory acuity. Nose:  No deformity, discharge,  or lesions. Mouth:  No deformity or lesions, oral mucosa pink. No bottome dentures Neck:  Supple, without mass or thyromegaly. Lungs:  Clear to auscultation bilaterally. No wheezes, rales, or rhonchi. No distress.  Heart:  S1, S2 present without murmurs appreciated.  Abdomen:  +BS, soft, non-tender and non-distended. No HSM noted. No guarding or rebound. No masses appreciated.  Rectal:  Deferred  Msk:  Symmetrical without gross deformities. Normal posture. Extremities:  Without clubbing or edema. Neurologic:  Alert and  oriented x4;   grossly normal neurologically. Skin:  Intact without significant lesions or rashes. Cervical Nodes:  No significant cervical adenopathy. Psych:  Alert and cooperative. Normal mood and affect.

## 2012-07-22 NOTE — Assessment & Plan Note (Signed)
67 year old female with vague reports of nausea for 5 months, intermittent, no vomiting or abdominal pain. Associated weight loss of around 10 lbs in the past few months. She attributes her symptoms to ill-fitting dentures, stating she "sucks" on them, which causes nausea. Possible anemia, as this is reason for referral. However, I am unable to retrieve any CBC or blood work to verify this. Last colonoscopy in 2010, with overall poor prep and recommendations for screening again in 2015.   Check CBC, iron, ferritin EGD with Dr. Darrick Penna in near future (with likely TCS). The risks, benefits, and alternatives were discussed in detail, and patient stated understanding.

## 2012-07-22 NOTE — Progress Notes (Unsigned)
As we have been unable to obtain appropriate labs from PCP, let's just go ahead and order CBC, iron, and ferritin. Need this on file to determine further work-up.

## 2012-07-22 NOTE — Assessment & Plan Note (Signed)
Multifactorial; needs dentures to fit properly. Advised to seek care for this. Unable to exclude underlying gastritis, PUD, occult malignancy. EGD/TCS in near future after review of labs.

## 2012-07-23 NOTE — Progress Notes (Signed)
Cc PCP 

## 2012-07-28 ENCOUNTER — Other Ambulatory Visit: Payer: Self-pay

## 2012-07-28 DIAGNOSIS — D649 Anemia, unspecified: Secondary | ICD-10-CM

## 2012-07-28 NOTE — Progress Notes (Signed)
Called to tell pt. LM for a return call. Lab order faxed to [facility].

## 2012-07-29 NOTE — Progress Notes (Signed)
Pt returned call and was informed and will try to go to the lab in the next few days.

## 2012-12-01 ENCOUNTER — Other Ambulatory Visit (HOSPITAL_COMMUNITY): Payer: Self-pay | Admitting: General Practice

## 2012-12-01 DIAGNOSIS — Z139 Encounter for screening, unspecified: Secondary | ICD-10-CM

## 2012-12-03 ENCOUNTER — Ambulatory Visit (HOSPITAL_COMMUNITY)
Admission: RE | Admit: 2012-12-03 | Discharge: 2012-12-03 | Disposition: A | Discharge status: Home / Self Care | Payer: Medicare Other | Source: Ambulatory Visit | Attending: General Practice | Admitting: General Practice

## 2012-12-03 DIAGNOSIS — Z139 Encounter for screening, unspecified: Secondary | ICD-10-CM

## 2012-12-03 DIAGNOSIS — Z1231 Encounter for screening mammogram for malignant neoplasm of breast: Secondary | ICD-10-CM | POA: Insufficient documentation

## 2013-01-02 NOTE — Progress Notes (Signed)
PT FAILED TO ET LABS OR ENDO. CONTACT TO [facility].

## 2013-01-19 ENCOUNTER — Encounter: Payer: Self-pay | Admitting: Gastroenterology

## 2013-01-19 NOTE — Progress Notes (Signed)
Pt is aware of OV on 12/2 at 230 with AS and letter with appt card was mailed

## 2013-01-19 NOTE — Progress Notes (Signed)
Please send letter for patient to follow-up with [place]. Needs appt. Did not get blood work or procedures done.

## 2013-02-09 ENCOUNTER — Ambulatory Visit (INDEPENDENT_AMBULATORY_CARE_PROVIDER_SITE_OTHER): Payer: Medicare Other | Admitting: Gastroenterology

## 2013-02-09 ENCOUNTER — Encounter (INDEPENDENT_AMBULATORY_CARE_PROVIDER_SITE_OTHER): Payer: Self-pay

## 2013-02-09 ENCOUNTER — Encounter: Payer: Self-pay | Admitting: Gastroenterology

## 2013-02-09 VITALS — BP 157/90 | HR 87 | Temp 97.4°F | Wt 127.6 lb

## 2013-02-09 DIAGNOSIS — R634 Abnormal weight loss: Secondary | ICD-10-CM

## 2013-02-09 LAB — CBC
HCT: 37.1 % (ref 36.0–46.0)
Hemoglobin: 12.6 g/dL (ref 12.0–15.0)
MCH: 30 pg (ref 26.0–34.0)
MCHC: 34 g/dL (ref 30.0–36.0)
MCV: 88.3 fL (ref 78.0–100.0)
Platelets: 465 10*3/uL — ABNORMAL HIGH (ref 150–400)
RBC: 4.2 MIL/uL (ref 3.87–5.11)
RDW: 14.6 % (ref 11.5–15.5)
WBC: 9.8 10*3/uL (ref 4.0–10.5)

## 2013-02-09 LAB — IRON: Iron: 43 ug/dL (ref 42–145)

## 2013-02-09 NOTE — Progress Notes (Signed)
Referring Provider: Calla Kicks, MD Primary Care Physician:  Calla Kicks, MD Primary GI: Dr. Darrick Penna   Chief Complaint  Patient presents with  . Follow-up    HPI:   [name] presents today in follow-up; she was last seen May 2014 with a referral reason of "anemia". No blood work was included with referring information, despite multiple attempts to retrieve. Therefore, I had requested a CBC, iron, and ferritin from patient. She did not complete this. Weight loss noted per patient; I had recommended likely an EGD and TCS. She is here to discuss this. Last colonoscopy by Dr. Darrick Penna in 2010 with benign polyp.  She has lost another 2 lbs since last seen by our practice. Normal weight in the 130s; however in May she states her normal weight was in the 140s. States appetite has picked up since last seen. Had some work done on her teeth. Thinks her dentures were an issue with nausea and lack of appetite. Dentures holding better. Denies nausea. EARLY SATIETY. No dysphagia. No GERD. No changes in bowel habits. No rectal bleeding. No abdominal pain.     Past Medical History  Diagnosis Date  . Hypertension   . Diabetes   . High cholesterol   . Myocardial infarction 2004.    Past Surgical History  Procedure Laterality Date  . Colonoscopy  02/14/09    [id number] colon polyp/small internal hemorrhoids, benign polyp  . Back surgery    . Abdominal hysterectomy    . Neck surgery    . Colonoscopy  02/14/2009    SLF:3-mm sessile sigmoid colon polyp/frequent ascending colon and diverticula/small internal hemorrhoids    Current Outpatient Prescriptions  Medication Sig Dispense Refill  . aspirin 81 MG tablet Take 81 mg by mouth daily.      Marland Kitchen glipiZIDE-metformin (METAGLIP) 5-500 MG per tablet Take 1 tablet by mouth 2 (two) times daily before a meal.       . hydrochlorothiazide (HYDRODIURIL) 25 MG tablet Take 25 mg by mouth daily.        No current facility-administered  medications for this visit.    Allergies as of 02/09/2013  . (No Known Allergies)    Family History  Problem Relation Age of Onset  . Colon cancer Neg Hx     History   Social History  . Marital Status: Widowed    Spouse Name: N/A    Number of Children: N/A  . Years of Education: N/A   Social History Main Topics  . Smoking status: Never Smoker   . Smokeless tobacco: None  . Alcohol Use: No  . Drug Use: No  . Sexual Activity: None   Other Topics Concern  . None   Social History Narrative  . None    Review of Systems: Gen: Denies fever, chills, anorexia. Denies fatigue, weakness, weight loss.  CV: Denies chest pain, palpitations, syncope, peripheral edema, and claudication. Resp: Denies dyspnea at rest, cough, wheezing, coughing up blood, and pleurisy. GI: see HPI Derm: +dry skin Psych: Denies depression, anxiety, memory loss, confusion. No homicidal or suicidal ideation.  Heme: Denies bruising, bleeding, and enlarged lymph nodes.  Physical Exam: BP 157/90  Pulse 87  Temp(Src) 97.4 F (36.3 C) (Oral)  Wt 127 lb 9.6 oz (57.879 kg) General:   Alert and oriented. No distress noted. Pleasant and cooperative.  Head:  Normocephalic and atraumatic. Eyes:  Conjuctiva clear without scleral icterus. Mouth:  Oral mucosa pink and moist. Dentures present Neck:  Supple, without mass or thyromegaly.  Heart:  S1, S2 present without murmurs, rubs, or gallops. Regular rate and rhythm. Abdomen:  +BS, soft, non-tender and non-distended. No rebound or guarding. No HSM or masses noted. Msk:  Symmetrical without gross deformities. Normal posture. Extremities:  Without edema. Neurologic:  Alert and  oriented x4;  grossly normal neurologically. Skin:  Intact without significant lesions or rashes. Cervical Nodes:  No significant cervical adenopathy. Psych:  Alert and cooperative. Normal mood and affect.

## 2013-02-09 NOTE — Patient Instructions (Signed)
Please complete the blood work. We will call you with the results and the recommendations for the next step.  Have a wonderful Holiday season!

## 2013-02-10 LAB — FERRITIN: Ferritin: 45 ng/mL (ref 10–291)

## 2013-02-10 LAB — TSH: TSH: 0.962 u[IU]/mL (ref 0.350–4.500)

## 2013-02-11 NOTE — Progress Notes (Signed)
Quick Note:  Reviewed labs. TSH normal. No anemia on CBC. Iron, ferritin normal.  Proceed with EGD with Dr. Darrick Penna. Hold off on colonoscopy now.  Please schedule. Thanks! ______

## 2013-02-11 NOTE — Assessment & Plan Note (Signed)
67 year old female with early satiety and weight loss, concern for gastritis, PUD, unable to exclude occult malignancy. Some question of anemia per patient (see HPI). Need to update CBC, iron, ferritin, and obtain TSH. Recommend endoscopy in near future with Dr. Darrick Penna. May ultimately need repeat colonoscopy if evidence of IDA. Labs now with further recommendations to follow.

## 2013-02-15 ENCOUNTER — Other Ambulatory Visit: Payer: Self-pay | Admitting: Gastroenterology

## 2013-02-15 DIAGNOSIS — D649 Anemia, unspecified: Secondary | ICD-10-CM

## 2013-02-15 NOTE — Progress Notes (Signed)
cc'd to pcp 

## 2013-02-18 ENCOUNTER — Encounter (HOSPITAL_COMMUNITY): Payer: Self-pay | Admitting: Pharmacy Technician

## 2013-02-26 ENCOUNTER — Encounter (HOSPITAL_COMMUNITY)
Admission: RE | Disposition: A | Discharge status: Home / Self Care | Payer: Self-pay | Source: Ambulatory Visit | Attending: Gastroenterology

## 2013-02-26 ENCOUNTER — Ambulatory Visit (HOSPITAL_COMMUNITY)
Admission: RE | Admit: 2013-02-26 | Discharge: 2013-02-26 | Disposition: A | Discharge status: Home / Self Care | Payer: Medicare Other | Source: Ambulatory Visit | Attending: Gastroenterology | Admitting: Gastroenterology

## 2013-02-26 ENCOUNTER — Encounter (HOSPITAL_COMMUNITY): Payer: Self-pay | Admitting: *Deleted

## 2013-02-26 DIAGNOSIS — Z01812 Encounter for preprocedural laboratory examination: Secondary | ICD-10-CM | POA: Insufficient documentation

## 2013-02-26 DIAGNOSIS — Z7982 Long term (current) use of aspirin: Secondary | ICD-10-CM | POA: Insufficient documentation

## 2013-02-26 DIAGNOSIS — K2970 Gastritis, unspecified, without bleeding: Secondary | ICD-10-CM

## 2013-02-26 DIAGNOSIS — R634 Abnormal weight loss: Secondary | ICD-10-CM

## 2013-02-26 DIAGNOSIS — E7800 Pure hypercholesterolemia, unspecified: Secondary | ICD-10-CM | POA: Insufficient documentation

## 2013-02-26 DIAGNOSIS — D649 Anemia, unspecified: Secondary | ICD-10-CM

## 2013-02-26 DIAGNOSIS — E119 Type 2 diabetes mellitus without complications: Secondary | ICD-10-CM | POA: Insufficient documentation

## 2013-02-26 DIAGNOSIS — I10 Essential (primary) hypertension: Secondary | ICD-10-CM | POA: Insufficient documentation

## 2013-02-26 DIAGNOSIS — Z79899 Other long term (current) drug therapy: Secondary | ICD-10-CM | POA: Insufficient documentation

## 2013-02-26 DIAGNOSIS — K2990 Gastroduodenitis, unspecified, without bleeding: Secondary | ICD-10-CM

## 2013-02-26 DIAGNOSIS — K2940 Chronic atrophic gastritis without bleeding: Secondary | ICD-10-CM | POA: Insufficient documentation

## 2013-02-26 HISTORY — PX: ESOPHAGOGASTRODUODENOSCOPY: SHX5428

## 2013-02-26 LAB — GLUCOSE, CAPILLARY: Glucose-Capillary: 164 mg/dL — ABNORMAL HIGH (ref 70–99)

## 2013-02-26 SURGERY — EGD (ESOPHAGOGASTRODUODENOSCOPY)
Anesthesia: Moderate Sedation

## 2013-02-26 MED ORDER — MIDAZOLAM HCL 5 MG/5ML IJ SOLN
INTRAMUSCULAR | Status: DC | PRN
  Administered 2013-02-26 (×2): 2 mg via INTRAVENOUS

## 2013-02-26 MED ORDER — SODIUM CHLORIDE 0.9 % IV SOLN
INTRAVENOUS | Status: DC
  Administered 2013-02-26: 09:00:00 via INTRAVENOUS

## 2013-02-26 MED ORDER — STERILE WATER FOR IRRIGATION IR SOLN
Status: DC | PRN
  Administered 2013-02-26: 09:00:00

## 2013-02-26 MED ORDER — OMEPRAZOLE 20 MG PO CPDR: DELAYED_RELEASE_CAPSULE | ORAL | Status: DC

## 2013-02-26 MED ORDER — MEPERIDINE HCL 100 MG/ML IJ SOLN
INTRAMUSCULAR | Status: DC | PRN
  Administered 2013-02-26 (×2): 25 mg via INTRAVENOUS

## 2013-02-26 MED ORDER — MEPERIDINE HCL 100 MG/ML IJ SOLN
INTRAMUSCULAR | Status: AC
  Filled 2013-02-26: qty 2

## 2013-02-26 MED ORDER — BUTAMBEN-TETRACAINE-BENZOCAINE 2-2-14 % EX AERO
INHALATION_SPRAY | CUTANEOUS | Status: DC | PRN
  Administered 2013-02-26: 2 via TOPICAL

## 2013-02-26 MED ORDER — MIDAZOLAM HCL 5 MG/5ML IJ SOLN
INTRAMUSCULAR | Status: AC
  Filled 2013-02-26: qty 10

## 2013-02-26 NOTE — H&P (Signed)
  Primary Care Physician:  Calla Kicks, MD Primary Gastroenterologist:  Dr. Darrick Penna  Pre-Procedure History & Physical: HPI:  [name] is a 67 y.o. female here for Anorexia/weight loss.  Past Medical History  Diagnosis Date  . Hypertension   . Diabetes   . High cholesterol   . Myocardial infarction 2004.    Past Surgical History  Procedure Laterality Date  . Colonoscopy  02/14/09    NUU:[id number] colon polyp/small internal hemorrhoids, benign polyp  . Back surgery    . Abdominal hysterectomy    . Neck surgery    . Colonoscopy  02/14/2009    SLF:3-mm sessile sigmoid colon polyp/frequent ascending colon and diverticula/small internal hemorrhoids    Prior to Admission medications   Medication Sig Start Date End Date Taking? Authorizing Provider  aspirin 81 MG tablet Take 81 mg by mouth daily.   Yes Historical Provider, MD  glipiZIDE-metformin (METAGLIP) 5-500 MG per tablet Take 1 tablet by mouth 2 (two) times daily before a meal.  06/25/12  Yes Historical Provider, MD  hydrochlorothiazide (HYDRODIURIL) 25 MG tablet Take 25 mg by mouth daily.  06/17/12  Yes Historical Provider, MD    Allergies as of 02/15/2013  . (No Known Allergies)    Family History  Problem Relation Age of Onset  . Colon cancer Neg Hx     History   Social History  . Marital Status: Widowed    Spouse Name: N/A    Number of Children: N/A  . Years of Education: N/A   Occupational History  . Not on file.   Social History Main Topics  . Smoking status: Never Smoker   . Smokeless tobacco: Not on file  . Alcohol Use: No  . Drug Use: No  . Sexual Activity: Not on file   Other Topics Concern  . Not on file   Social History Narrative  . No narrative on file    Review of Systems: See HPI, otherwise negative ROS   Physical Exam: BP 141/87  Pulse 94  Temp(Src) 98.5 F (36.9 C) (Oral)  Resp 16  Ht 5' (1.524 m)  Wt 127 lb (57.607 kg)  BMI 24.80 kg/m2  SpO2 98% General:    Alert,  pleasant and cooperative in NAD Head:  Normocephalic and atraumatic. Neck:  Supple; Lungs:  Clear throughout to auscultation.    Heart:  Regular rate and rhythm. Abdomen:  Soft, nontender and nondistended. Normal bowel sounds, without guarding, and without rebound.   Neurologic:  Alert and  oriented x4;  grossly normal neurologically.  Impression/Plan:     Anorexia/weight loss  PLAN: 1.EGD TODAY

## 2013-02-26 NOTE — Progress Notes (Signed)
REVIEWED.  

## 2013-02-26 NOTE — Op Note (Signed)
[facility] [address] [place], [address]   ENDOSCOPY PROCEDURE REPORT  PATIENT: Amber, Schwartz  MR#: [id number] BIRTHDATE: 1945-12-09 , 67  yrs. old GENDER: Female  ENDOSCOPIST: Jonette Eva, MD REFERRED XB:[id number] Reinaldo Berber, M.D.  PROCEDURE DATE: 02/26/2013 PROCEDURE:   EGD w/ biopsy  INDICATIONS:Weight loss. MEDICATIONS: Demerol 50 mg IV and Versed 4 mg IV TOPICAL ANESTHETIC:   Cetacaine Spray  DESCRIPTION OF PROCEDURE:     Physical exam was performed.  Informed consent was obtained from the patient after explaining the benefits, risks, and alternatives to the procedure.  The patient was connected to the monitor and placed in the left lateral position.  Continuous oxygen was provided by nasal cannula and IV medicine administered through an indwelling cannula.  After administration of sedation, the patients esophagus was intubated and the EG-[id number] ([id number])  endoscope was advanced under direct visualization to the second portion of the duodenum.  The scope was removed slowly by carefully examining the color, texture, anatomy, and integrity of the mucosa on the way out.  The patient was recovered in endoscopy and discharged home in satisfactory condition.   ESOPHAGUS: The mucosa of the esophagus appeared normal.   STOMACH: Moderate non-erosive gastritis (inflammation) was found in the gastric antrum and gastric body.  Multiple biopsies were performed using cold forceps.   DUODENUM: The duodenal mucosa showed no abnormalities in the bulb and second portion of the duodenum.  COMPLICATIONS:   None  ENDOSCOPIC IMPRESSION: 1.   MILD NON-erosive gastritis  RECOMMENDATIONS: TAKE OMEPRAZOLE 30 MINUTES PRIOR TO MEALS ONCE DAILY FOR THE NEXT 6 MOS. AVOID TRIGGERS FOR GASTRITIS. FOLLOW A LOW FAT DIET. FOLLOW UP IN 4 MOS.   REPEAT EXAM:   _______________________________ Rosalie DoctorJonette Eva, MD 02/26/2013 9:54 AM

## 2013-03-02 ENCOUNTER — Encounter (HOSPITAL_COMMUNITY): Payer: Self-pay | Admitting: Gastroenterology

## 2013-03-02 ENCOUNTER — Telehealth: Payer: Self-pay | Admitting: Gastroenterology

## 2013-03-02 NOTE — Telephone Encounter (Signed)
Called and informed pt.  

## 2013-03-02 NOTE — Telephone Encounter (Signed)
Please call pt. HER stomach Bx shows gastritis.   TAKE OMEPRAZOLE 30 MINUTES PRIOR TO MEALS ONCE DAILY FOR THE NEXT 6 MONTHS. AVOID TRIGGERS FOR GASTRITIS.  FOLLOW A LOW FAT DIET.   FOLLOW UP IN 4 MOS E 30 GASTRITIS.

## 2013-03-03 NOTE — Telephone Encounter (Signed)
Reminder in epic °

## 2013-12-23 ENCOUNTER — Other Ambulatory Visit (HOSPITAL_COMMUNITY): Payer: Self-pay | Admitting: Family

## 2013-12-23 DIAGNOSIS — Z1231 Encounter for screening mammogram for malignant neoplasm of breast: Secondary | ICD-10-CM

## 2013-12-28 ENCOUNTER — Encounter (HOSPITAL_COMMUNITY): Payer: Self-pay | Admitting: Emergency Medicine

## 2013-12-28 ENCOUNTER — Emergency Department (HOSPITAL_COMMUNITY)
Admission: EM | Admit: 2013-12-28 | Discharge: 2013-12-28 | Disposition: A | Discharge status: Home / Self Care | Payer: Medicare HMO | Attending: Emergency Medicine | Admitting: Emergency Medicine

## 2013-12-28 ENCOUNTER — Emergency Department (HOSPITAL_COMMUNITY): Payer: Medicare HMO

## 2013-12-28 DIAGNOSIS — Z79899 Other long term (current) drug therapy: Secondary | ICD-10-CM | POA: Insufficient documentation

## 2013-12-28 DIAGNOSIS — I252 Old myocardial infarction: Secondary | ICD-10-CM | POA: Insufficient documentation

## 2013-12-28 DIAGNOSIS — R05 Cough: Secondary | ICD-10-CM | POA: Diagnosis not present

## 2013-12-28 DIAGNOSIS — Z7982 Long term (current) use of aspirin: Secondary | ICD-10-CM | POA: Insufficient documentation

## 2013-12-28 DIAGNOSIS — R059 Cough, unspecified: Secondary | ICD-10-CM

## 2013-12-28 DIAGNOSIS — R0981 Nasal congestion: Secondary | ICD-10-CM | POA: Diagnosis not present

## 2013-12-28 DIAGNOSIS — Z792 Long term (current) use of antibiotics: Secondary | ICD-10-CM | POA: Diagnosis not present

## 2013-12-28 DIAGNOSIS — I10 Essential (primary) hypertension: Secondary | ICD-10-CM | POA: Insufficient documentation

## 2013-12-28 DIAGNOSIS — R1110 Vomiting, unspecified: Secondary | ICD-10-CM | POA: Diagnosis not present

## 2013-12-28 DIAGNOSIS — R0789 Other chest pain: Secondary | ICD-10-CM | POA: Diagnosis not present

## 2013-12-28 DIAGNOSIS — E119 Type 2 diabetes mellitus without complications: Secondary | ICD-10-CM | POA: Insufficient documentation

## 2013-12-28 NOTE — ED Provider Notes (Signed)
CSN: [id number]     Arrival date & time 12/28/13  1009 History   First MD Initiated Contact with Patient 12/28/13 1013     Chief Complaint  Patient presents with  . Cough  . Emesis     (Consider location/radiation/quality/duration/timing/severity/associated sxs/prior Treatment) Patient is a 68 y.o. female presenting with cough and vomiting. The history is provided by the patient. No language interpreter was used.  Cough Cough characteristics:  Productive Severity:  Moderate Onset quality:  Gradual Duration:  3 weeks Timing:  Constant Associated symptoms: no chills and no fever   Associated symptoms comment:  She presents for evaluation of persistent cough and congestion for the past 3 weeks. No known fever. She has been seen by her physician and reports completing a 10-day regimen of Amoxil, and is now on Levaquin but symptoms haven't changed. She denies appetite change, has chest tightness that is mild and associated with cough, and continues per her usual activity. Emesis Associated symptoms: no chills     Past Medical History  Diagnosis Date  . Hypertension   . Diabetes   . High cholesterol   . Myocardial infarction 2004.   Past Surgical History  Procedure Laterality Date  . Colonoscopy  02/14/09    WUJ:WJXBJYNSLF:sigmoid colon polyp/small internal hemorrhoids, benign polyp  . Back surgery    . Abdominal hysterectomy    . Neck surgery    . Colonoscopy  02/14/2009    SLF:3-mm sessile sigmoid colon polyp/frequent ascending colon and diverticula/small internal hemorrhoids  . Esophagogastroduodenoscopy N/A 02/26/2013    Procedure: ESOPHAGOGASTRODUODENOSCOPY (EGD);  Surgeon: West BaliSandi L Fields, MD;  Location: [facility];  Service: Endoscopy;  Laterality: N/A;  9:30   Family History  Problem Relation Age of Onset  . Colon cancer Neg Hx    History  Substance Use Topics  . Smoking status: Never Smoker   . Smokeless tobacco: Not on file  . Alcohol Use: No   OB History   Grav Para  Term Preterm Abortions TAB SAB Ect Mult Living                 Review of Systems  Constitutional: Negative for fever and chills.  HENT: Positive for congestion.   Respiratory: Positive for cough and chest tightness.   Cardiovascular: Negative.   Gastrointestinal: Positive for vomiting.  Musculoskeletal: Negative.   Skin: Negative.   Neurological: Negative.       Allergies  Review of patient's allergies indicates no known allergies.  Home Medications   Prior to Admission medications   Medication Sig Start Date End Date Taking? Authorizing Provider  aspirin 81 MG tablet Take 81 mg by mouth daily.   Yes Historical Provider, MD  ferrous sulfate 325 (65 FE) MG tablet Take 325 mg by mouth daily with breakfast.   Yes Historical Provider, MD  glipiZIDE-metformin (METAGLIP) 5-500 MG per tablet Take 1 tablet by mouth 2 (two) times daily before a meal.  06/25/12  Yes Historical Provider, MD  hydrochlorothiazide (HYDRODIURIL) 25 MG tablet Take 25 mg by mouth daily.  06/17/12  Yes Historical Provider, MD  levofloxacin (LEVAQUIN) 750 MG tablet Take 1 tablet by mouth daily. 12/21/13  Yes Historical Provider, MD  omeprazole (PRILOSEC) 20 MG capsule 1 PO EVERY MORNING 02/26/13  Yes West BaliSandi L Fields, MD  Potassium 99 MG TABS Take 1 tablet by mouth daily.   Yes Historical Provider, MD   BP 155/95  Pulse 99  Temp(Src) 98 F (36.7 C) (Oral)  Resp 18  Ht 5\' 1"  (1.549 m)  Wt 130 lb (58.968 kg)  BMI 24.58 kg/m2  SpO2 100% Physical Exam  Constitutional: She is oriented to person, place, and time. She appears well-developed and well-nourished.  HENT:  Head: Normocephalic.  Neck: Normal range of motion. Neck supple.  Cardiovascular: Normal rate and regular rhythm.   Pulmonary/Chest: Effort normal and breath sounds normal.  Abdominal: Soft. Bowel sounds are normal. There is no tenderness. There is no rebound and no guarding.  Musculoskeletal: Normal range of motion. She exhibits no edema.   Neurological: She is alert and oriented to person, place, and time.  Skin: Skin is warm and dry. No rash noted.  Psychiatric: She has a normal mood and affect.    ED Course  Procedures (including critical care time) Labs Review Labs Reviewed - No data to display  Imaging Review Dg Chest 2 View  12/28/2013   CLINICAL DATA:  Cough, dizziness and weakness for 2 weeks.  EXAM: CHEST  2 VIEW  COMPARISON:  05/31/2007  FINDINGS: The heart size and mediastinal contours are within normal limits. Mild tortuosity and calcification of the thoracic aorta. Both lungs are clear. The visualized skeletal structures are unremarkable.  IMPRESSION: No acute cardiopulmonary findings.   Electronically Signed   By: Loralie ChampagneMark  Gallerani M.D.   On: 12/28/2013 11:02     EKG Interpretation   Date/Time:  Tuesday December 28 2013 10:22:11 EDT Ventricular Rate:  94 PR Interval:  162 QRS Duration: 91 QT Interval:  382 QTC Calculation: 478 R Axis:   66 Text Interpretation:  Sinus rhythm Baseline wander in lead(s) V4 No  significant change since last tracing Confirmed by Bebe ShaggyWICKLINE  MD, DONALD  [phone]) on 12/28/2013 10:37:35 AM      MDM   Final diagnoses:  None    1. Cough  Suspect viral process as she has been on appropriate abx for bacterial URI without change or improvement. She is stable without hypoxia, fever or tachypnea. Dr. Bebe ShaggyWickline has evaluated the patient and she is felt stable for discharge.     Arnoldo HookerShari A Tonnia Bardin, PA-C 12/28/13 1111

## 2013-12-28 NOTE — ED Provider Notes (Signed)
Medical screening examination/treatment/procedure(s) were conducted as a shared visit with non-physician practitioner(s) and myself.  I personally evaluated the patient during the encounter.   EKG Interpretation   Date/Time:  Tuesday December 28 2013 10:22:11 EDT Ventricular Rate:  94 PR Interval:  162 QRS Duration: 91 QT Interval:  382 QTC Calculation: 478 R Axis:   66 Text Interpretation:  Sinus rhythm Baseline wander in lead(s) V4 No  significant change since last tracing Confirmed by Bebe ShaggyWICKLINE  MD, Ceri Mayer  [phone] on 12/28/2013 10:37:35 AM        Joya Gaskinsonald W Chynah Orihuela, MD 12/28/13 1134

## 2013-12-28 NOTE — ED Notes (Signed)
Patient complaining of cough x 3 weeks. States "I've been to my doctor twice and he told me I had a sinus infection and he gave me antibiotics." Also complaining of vomiting yesterday.

## 2013-12-28 NOTE — Discharge Instructions (Signed)
Cough, Adult   A cough is a reflex. It helps you clear your throat and airways. A cough can help heal your body. A cough can last 2 or 3 weeks (acute) or may last more than 8 weeks (chronic). Some common causes of a cough can include an infection, allergy, or a cold.  HOME CARE  · Only take medicine as told by your doctor.  · If given, take your medicines (antibiotics) as told. Finish them even if you start to feel better.  · Use a cold steam vaporizer or humidifier in your home. This can help loosen thick spit (secretions).  · Sleep so you are almost sitting up (semi-upright). Use pillows to do this. This helps reduce coughing.  · Rest as needed.  · Stop smoking if you smoke.  GET HELP RIGHT AWAY IF:  · You have yellowish-white fluid (pus) in your thick spit.  · Your cough gets worse.  · Your medicine does not reduce coughing, and you are losing sleep.  · You cough up blood.  · You have trouble breathing.  · Your pain gets worse and medicine does not help.  · You have a fever.  MAKE SURE YOU:   · Understand these instructions.  · Will watch your condition.  · Will get help right away if you are not doing well or get worse.  Document Released: 11/08/2010 Document Revised: 07/12/2013 Document Reviewed: 11/08/2010  ExitCare® Patient Information ©2015 [employer]. This information is not intended to replace advice given to you by your health care provider. Make sure you discuss any questions you have with your health care provider.

## 2013-12-28 NOTE — ED Provider Notes (Signed)
Patient seen/examined in the Emergency Department in conjunction with Midlevel Provider [facility]still Patient reports cough for several weeks Exam : awake/alert, no distress, lung sounds clear bilaterally Plan: CXR pending, then likely discharge home   Amber Gaskinsonald W Joeziah Voit, MD 12/28/13 1104

## 2013-12-30 ENCOUNTER — Ambulatory Visit (HOSPITAL_COMMUNITY)
Admission: RE | Admit: 2013-12-30 | Discharge: 2013-12-30 | Disposition: A | Discharge status: Home / Self Care | Payer: Medicare HMO | Source: Ambulatory Visit | Attending: Family | Admitting: Family

## 2013-12-30 DIAGNOSIS — Z1231 Encounter for screening mammogram for malignant neoplasm of breast: Secondary | ICD-10-CM | POA: Diagnosis not present

## 2014-01-17 ENCOUNTER — Encounter: Payer: Self-pay | Admitting: Gastroenterology

## 2014-02-23 ENCOUNTER — Emergency Department (HOSPITAL_COMMUNITY)
Admission: EM | Admit: 2014-02-23 | Discharge: 2014-02-23 | Disposition: A | Discharge status: Home / Self Care | Payer: Medicare HMO | Attending: Emergency Medicine | Admitting: Emergency Medicine

## 2014-02-23 ENCOUNTER — Encounter (HOSPITAL_COMMUNITY): Payer: Self-pay | Admitting: Emergency Medicine

## 2014-02-23 ENCOUNTER — Emergency Department (HOSPITAL_COMMUNITY): Payer: Medicare HMO

## 2014-02-23 DIAGNOSIS — Z7982 Long term (current) use of aspirin: Secondary | ICD-10-CM | POA: Diagnosis not present

## 2014-02-23 DIAGNOSIS — M795 Residual foreign body in soft tissue: Secondary | ICD-10-CM | POA: Insufficient documentation

## 2014-02-23 DIAGNOSIS — L02512 Cutaneous abscess of left hand: Secondary | ICD-10-CM | POA: Diagnosis present

## 2014-02-23 DIAGNOSIS — I10 Essential (primary) hypertension: Secondary | ICD-10-CM | POA: Insufficient documentation

## 2014-02-23 DIAGNOSIS — Z79899 Other long term (current) drug therapy: Secondary | ICD-10-CM | POA: Insufficient documentation

## 2014-02-23 DIAGNOSIS — I252 Old myocardial infarction: Secondary | ICD-10-CM | POA: Insufficient documentation

## 2014-02-23 DIAGNOSIS — E119 Type 2 diabetes mellitus without complications: Secondary | ICD-10-CM | POA: Diagnosis not present

## 2014-02-23 LAB — CBG MONITORING, ED: Glucose-Capillary: 207 mg/dL — ABNORMAL HIGH (ref 70–99)

## 2014-02-23 MED ORDER — HYDROCODONE-ACETAMINOPHEN 5-325 MG PO TABS: ORAL_TABLET | ORAL | Status: DC

## 2014-02-23 MED ORDER — SULFAMETHOXAZOLE-TRIMETHOPRIM 800-160 MG PO TABS
1.0000 | ORAL_TABLET | Freq: Once | ORAL | Status: AC
  Administered 2014-02-23: 1 via ORAL
  Filled 2014-02-23: qty 1

## 2014-02-23 MED ORDER — SULFAMETHOXAZOLE-TRIMETHOPRIM 800-160 MG PO TABS: 1.0000 | ORAL_TABLET | Freq: Two times a day (BID) | ORAL | Status: DC

## 2014-02-23 MED ORDER — LIDOCAINE HCL (PF) 1 % IJ SOLN
5.0000 mL | Freq: Once | INTRAMUSCULAR | Status: AC
  Administered 2014-02-23: 5 mL via INTRADERMAL
  Filled 2014-02-23: qty 5

## 2014-02-23 NOTE — ED Notes (Signed)
Pt was given a pre-pack of oxycodone-acetaminophen quantity six, given verbal instructions on use, pt verbally understands.

## 2014-02-23 NOTE — Discharge Instructions (Signed)
Abscess °An abscess (boil or furuncle) is an infected area on or under the skin. This area is filled with yellowish-white fluid (pus) and other material (debris). °HOME CARE  °· Only take medicines as told by your doctor. °· If you were given antibiotic medicine, take it as directed. Finish the medicine even if you start to feel better. °· If gauze is used, follow your doctor's directions for changing the gauze. °· To avoid spreading the infection: °¨ Keep your abscess covered with a bandage. °¨ Wash your hands well. °¨ Do not share personal care items, towels, or whirlpools with others. °¨ Avoid skin contact with others. °· Keep your skin and clothes clean around the abscess. °· Keep all doctor visits as told. °GET HELP RIGHT AWAY IF:  °· You have more pain, puffiness (swelling), or redness in the wound site. °· You have more fluid or blood coming from the wound site. °· You have muscle aches, chills, or you feel sick. °· You have a fever. °MAKE SURE YOU:  °· Understand these instructions. °· Will watch your condition. °· Will get help right away if you are not doing well or get worse. °Document Released: 08/14/2007 Document Revised: 08/27/2011 Document Reviewed: 05/10/2011 °ExitCare® Patient Information ©2015 [employer]. This information is not intended to replace advice given to you by your health care provider. Make sure you discuss any questions you have with your health care provider. ° °

## 2014-02-23 NOTE — ED Notes (Signed)
States she had place "come up" on her left index finger a week ago. Pt not sure if something bit her or if maybe she has a small piece of glass in it from when a jar broke. Left index finger red and swollen.

## 2014-02-23 NOTE — ED Provider Notes (Signed)
  Face-to-face evaluation   History: She complains of a sore area, left index finger for one week without, known cause.  The pain is slightly worse.  Physical exam: Left dorsal index finger, proximally with a 1.5 cm raised area with central, scab.  Raised area is fluctuant and very tender to touch.  Medical screening examination/treatment/procedure(s) were conducted as a shared visit with non-physician practitioner(s) and myself.  I personally evaluated the patient during the encounter  Flint MelterElliott L Lashaun Poch, MD 02/26/14 [phone]

## 2014-02-25 ENCOUNTER — Encounter (HOSPITAL_COMMUNITY): Payer: Self-pay | Admitting: Cardiology

## 2014-02-25 ENCOUNTER — Emergency Department (HOSPITAL_COMMUNITY)
Admission: EM | Admit: 2014-02-25 | Discharge: 2014-02-25 | Disposition: A | Discharge status: Home / Self Care | Payer: Medicare HMO | Attending: Emergency Medicine | Admitting: Emergency Medicine

## 2014-02-25 DIAGNOSIS — Z7982 Long term (current) use of aspirin: Secondary | ICD-10-CM | POA: Insufficient documentation

## 2014-02-25 DIAGNOSIS — Z792 Long term (current) use of antibiotics: Secondary | ICD-10-CM | POA: Insufficient documentation

## 2014-02-25 DIAGNOSIS — I10 Essential (primary) hypertension: Secondary | ICD-10-CM | POA: Diagnosis not present

## 2014-02-25 DIAGNOSIS — I252 Old myocardial infarction: Secondary | ICD-10-CM | POA: Insufficient documentation

## 2014-02-25 DIAGNOSIS — Z79899 Other long term (current) drug therapy: Secondary | ICD-10-CM | POA: Diagnosis not present

## 2014-02-25 DIAGNOSIS — E119 Type 2 diabetes mellitus without complications: Secondary | ICD-10-CM | POA: Insufficient documentation

## 2014-02-25 DIAGNOSIS — L02512 Cutaneous abscess of left hand: Secondary | ICD-10-CM | POA: Diagnosis not present

## 2014-02-25 DIAGNOSIS — Z4801 Encounter for change or removal of surgical wound dressing: Secondary | ICD-10-CM | POA: Diagnosis present

## 2014-02-25 MED ORDER — ONDANSETRON 4 MG PO TBDP
4.0000 mg | ORAL_TABLET | Freq: Once | ORAL | Status: AC
  Administered 2014-02-25: 4 mg via ORAL
  Filled 2014-02-25: qty 1

## 2014-02-25 NOTE — ED Notes (Signed)
Here for wound recheck left pointer finger.

## 2014-02-25 NOTE — ED Notes (Addendum)
Pt alert & oriented x4, stable gait. Patient instructed to stop at the registration desk to finish any additional paperwork. Patient verbalized understanding. Pt left department w/ no further questions.

## 2014-02-25 NOTE — ED Provider Notes (Signed)
This chart was scribed for Amber MawKristen N Lindon Kiel, DO by Modena JanskyAlbert Thayil, ED Scribe. This patient was seen in room [facility] and the patient's care was started at .  TIME SEEN: 6:46 PM  CHIEF COMPLAINT: Wound Check  HPI: HPI Comments: [name] is a 68 y.o. female with history of hypertension, diabetes, hyperlipidemia who presents to the Emergency Department complaining of a need for a wound recheck on her left index finger. She states that she noticed an abscess on her left index finger and was seen for I&D on 02/23/14. She reports that the she feels better since she was seen and the redness has been improving. She reports that she has some emesis today since eating this morning. She states that she is right handed. She denies any fever or chills. States she has been taking her Bactrim as prescribed. She has also been soaking her hand in warm water.   ROS: See HPI Constitutional: no fever, no chills Eyes: no drainage  ENT: no runny nose   Cardiovascular:  no chest pain  Resp: no SOB  GI: vomiting GU: no dysuria Integumentary: no rash, wound Allergy: no hives  Musculoskeletal: no leg swelling  Neurological: no slurred speech ROS otherwise negative  PAST MEDICAL HISTORY/PAST SURGICAL HISTORY:  Past Medical History  Diagnosis Date  . Hypertension   . Diabetes   . High cholesterol   . Myocardial infarction 2004.    MEDICATIONS:  Prior to Admission medications   Medication Sig Start Date End Date Taking? Authorizing Provider  aspirin 81 MG tablet Take 81 mg by mouth daily.    Historical Provider, MD  ferrous sulfate 325 (65 FE) MG tablet Take 325 mg by mouth daily with breakfast.    Historical Provider, MD  glipiZIDE-metformin (METAGLIP) 5-500 MG per tablet Take 1 tablet by mouth 2 (two) times daily before a meal.  06/25/12   Historical Provider, MD  hydrochlorothiazide (HYDRODIURIL) 25 MG tablet Take 25 mg by mouth daily.  06/17/12   Historical Provider, MD   HYDROcodone-acetaminophen (NORCO/VICODIN) 5-325 MG per tablet Take one tab po q 4-6 hrs prn pain 02/23/14   Tammy L. Triplett, PA-C  HYDROcodone-acetaminophen (NORCO/VICODIN) 5-325 MG per tablet Take one tab po q 4-6 hrs prn pain 02/23/14   Tammy L. Triplett, PA-C  levofloxacin (LEVAQUIN) 750 MG tablet Take 1 tablet by mouth daily. 12/21/13   Historical Provider, MD  omeprazole (PRILOSEC) 20 MG capsule 1 PO EVERY MORNING 02/26/13   West BaliSandi L Fields, MD  Potassium 99 MG TABS Take 1 tablet by mouth daily.    Historical Provider, MD  sulfamethoxazole-trimethoprim (SEPTRA DS) 800-160 MG per tablet Take 1 tablet by mouth 2 (two) times daily. For 10 days 02/23/14   Tammy L. Triplett, PA-C    ALLERGIES:  No Known Allergies  SOCIAL HISTORY:  History  Substance Use Topics  . Smoking status: Never Smoker   . Smokeless tobacco: Not on file  . Alcohol Use: No    FAMILY HISTORY: Family History  Problem Relation Age of Onset  . Colon cancer Neg Hx     EXAM: BP 132/72 mmHg  Pulse 95  Temp(Src) 98.3 F (36.8 C) (Oral)  Resp 16  SpO2 100% CONSTITUTIONAL: Alert and oriented and responds appropriately to questions. Well-appearing; well-nourished; GCS 15 HEAD: Normocephalic; atraumatic EYES: Conjunctivae clear, PERRL, EOMI ENT: normal nose; no rhinorrhea; moist mucous membranes; pharynx without lesions noted; no dental injury; no hemotypanum; no septal hematoma NECK: Supple, no meningismus, no LAD; no midline  spinal tenderness, step-off or deformity CARD: RRR; S1 and S2 appreciated; no murmurs, no clicks, no rubs, no gallops RESP: Normal chest excursion without splinting or tachypnea; breath sounds clear and equal bilaterally; no wheezes, no rhonchi, no rales; chest wall stable, nontender to palpation ABD/GI: Normal bowel sounds; non-distended; soft, non-tender, no rebound, no guarding PELVIS:  stable, nontender to palpation BACK:  The back appears normal and is non-tender to palpation, there is  no CVA tenderness; no midline spinal tenderness, step-off or deformity EXT: Normal ROM in all joints; non-tender to palpation; no edema; normal capillary refill; no cyanosis    SKIN: Normal color for age and race; warm, left dorsal index finger mid between the MCP and PIP there is a 1 cm horizontal incision with minimal draingage, 1.5 cm area of erythema and TTP, no fluctuance or induration. No TTP over the palmar aspect of left index finger. Normal ROM. Normal sensation.  NEURO: Moves all extremities equally PSYCH: The patient's mood and manner are appropriate. Grooming and personal hygiene are appropriate.  MEDICAL DECISION MAKING: Patient here for wound recheck. She states that she is feeling much better, pain is improving, erythema and warmth are improving. She does have a small amount of purulent drainage but no fluctuance. There is still an open area over the dorsal left first digit. No tenderness over the flexor tendon on that finger. She has full range of motion. No systemic symptoms. Patient reports she had one episode of vomiting prior to arrival but is in the room and eating crackers currently without difficulty. Abdominal exam benign. She is afebrile, hemodynamically stable in the ED. I feel she is safe to be discharged home. Have advised her to follow-up with her primary care physician early next week for reevaluation in the next 72-96 hours to be reassessed. Have discussed with her return precautions. Patient verbalizes understanding and is comfortable with plan. Has been at bedside agrees as well.     I personally performed the services described in this documentation, which was scribed in my presence. The recorded information has been reviewed and is accurate.     Amber MawKristen N [name], DO 02/25/14 [phone]

## 2014-02-25 NOTE — Discharge Instructions (Signed)

## 2014-02-26 NOTE — ED Provider Notes (Signed)
CSN: [id number]     Arrival date & time 02/23/14  2012 History   First MD Initiated Contact with Patient 02/23/14 2103     Chief Complaint  Patient presents with  . Abscess     (Consider location/radiation/quality/duration/timing/severity/associated sxs/prior Treatment) HPI  [name] is a [age] y.o. female with a history of diabetes and hypertension who presents to the Emergency Department complaining of an area of swelling and pain to the left index finger that has been present for 1 week. She states that she is unsure if she was bitten by an insect or may have a piece of glass in her finger. She reports redness and pain with movement of the finger. She has not tried any therapies prior to ED arrival. She denies fever, chills, numbness or weakness of the finger or pain extending into her hand.   Past Medical History  Diagnosis Date  . Hypertension   . Diabetes   . High cholesterol   . Myocardial infarction 2004.   Past Surgical History  Procedure Laterality Date  . Colonoscopy  02/14/09    WUJ:WJXBJYNSLF:sigmoid colon polyp/small internal hemorrhoids, benign polyp  . Back surgery    . Abdominal hysterectomy    . Neck surgery    . Colonoscopy  02/14/2009    SLF:3-mm sessile sigmoid colon polyp/frequent ascending colon and diverticula/small internal hemorrhoids  . Esophagogastroduodenoscopy N/A 02/26/2013    Procedure: ESOPHAGOGASTRODUODENOSCOPY (EGD);  Surgeon: West BaliSandi L Fields, MD;  Location: [facility];  Service: Endoscopy;  Laterality: N/A;  9:30   Family History  Problem Relation Age of Onset  . Colon cancer Neg Hx    History  Substance Use Topics  . Smoking status: Never Smoker   . Smokeless tobacco: Not on file  . Alcohol Use: No   OB History    No data available     Review of Systems  Constitutional: Negative for fever and chills.  Gastrointestinal: Negative for nausea and vomiting.  Musculoskeletal: Negative for joint swelling and arthralgias.  Skin:  Positive for color change.       Swelling and redness of the left index finger  Neurological: Negative for dizziness, speech difficulty, weakness and headaches.  Hematological: Negative for adenopathy.  All other systems reviewed and are negative.     Allergies  Review of patient's allergies indicates no known allergies.  Home Medications   Prior to Admission medications   Medication Sig Start Date End Date Taking? Authorizing Provider  aspirin 81 MG tablet Take 81 mg by mouth daily.    Historical Provider, MD  ferrous sulfate 325 (65 FE) MG tablet Take 325 mg by mouth daily with breakfast.    Historical Provider, MD  glipiZIDE-metformin (METAGLIP) 5-500 MG per tablet Take 1 tablet by mouth 2 (two) times daily before a meal.  06/25/12   Historical Provider, MD  hydrochlorothiazide (HYDRODIURIL) 25 MG tablet Take 25 mg by mouth daily.  06/17/12   Historical Provider, MD  HYDROcodone-acetaminophen (NORCO/VICODIN) 5-325 MG per tablet Take one tab po q 4-6 hrs prn pain Patient not taking: Reported on 02/25/2014 02/23/14   Devontre Siedschlag L. Ogden Handlin, PA-C  HYDROcodone-acetaminophen (NORCO/VICODIN) 5-325 MG per tablet Take one tab po q 4-6 hrs prn pain 02/23/14   Kamariah Fruchter L. Avarey Yaeger, PA-C  omeprazole (PRILOSEC) 20 MG capsule 1 PO EVERY MORNING Patient taking differently: Take 20 mg by mouth every morning. 1 PO EVERY MORNING 02/26/13   West BaliSandi L Fields, MD  pioglitazone (ACTOS) 45 MG tablet Take 45 mg  by mouth daily. 02/17/14   Historical Provider, MD  quinapril (ACCUPRIL) 40 MG tablet Take 40 mg by mouth daily. 02/08/14   Historical Provider, MD  sulfamethoxazole-trimethoprim (SEPTRA DS) 800-160 MG per tablet Take 1 tablet by mouth 2 (two) times daily. For 10 days 02/23/14   Lillieann Pavlich L. Breeze Angell, PA-C   BP 167/69 mmHg  Pulse 94  Temp(Src) 98.4 F (36.9 C) (Oral)  Resp 20  Ht 5\' 1"  (1.549 m)  Wt 134 lb (60.782 kg)  BMI 25.33 kg/m2  SpO2 100% Physical Exam  Constitutional: She is oriented to person,  place, and time. She appears well-developed and well-nourished. No distress.  HENT:  Head: Normocephalic and atraumatic.  Cardiovascular: Normal rate, regular rhythm and normal heart sounds.   No murmur heard. Pulmonary/Chest: Effort normal and breath sounds normal. No respiratory distress.  Musculoskeletal: She exhibits edema and tenderness.       Left hand: She exhibits tenderness and swelling. She exhibits normal range of motion, no bony tenderness, normal two-point discrimination, normal capillary refill, no deformity and no laceration. Normal sensation noted. Normal strength noted. She exhibits no finger abduction and no thumb/finger opposition.       Hands: 1 cm circular area of erythema and fluctuance to the dorsal aspect of the proximal left index finger. Patient has full range of motion of the finger, cap refill is less than 2 seconds. No lymphangitis. Left wrist is nontender.  Neurological: She is alert and oriented to person, place, and time. She exhibits normal muscle tone. Coordination normal.  Skin: Skin is warm and dry.  Nursing note and vitals reviewed.   ED Course  Procedures (including critical care time) Labs Review Labs Reviewed  CBG MONITORING, ED - Abnormal; Notable for the following:    Glucose-Capillary 207 (*)    All other components within normal limits    Imaging Review No results found.   EKG Interpretation None       INCISION AND DRAINAGE Performed by: Maxwell CaulRIPLETT,Veida Spira L. Consent: Verbal consent obtained. Risks and benefits: risks, benefits and alternatives were discussed Type: abscess  Body area: left index finger Anesthesia: local infiltration  Incision was made with a #11 scalpel.  Local anesthetic: lidocaine 1% w/o epinephrine  Anesthetic total: 2 ml  Complexity: complex Blunt dissection to break up loculations  Drainage: purulent  Drainage amount: small  Packing material: none Patient tolerance: Patient tolerated the procedure  well with no immediate complications.     MDM   Final diagnoses:  Foreign body (FB) in soft tissue  Abscess of finger of left hand    Patient was also seen by Dr. Effie ShyWentz and care plan was discussed to include incision and drainage.  Symptoms are likely related to superficial abscess of the finger. Suspicion for infection involving the tendon is low.  Symptoms improved after I&D. Finger remains remains neurovascularly intact. Patient has full range of motion. No tenderness along the tendon, no lymphangitis. Plan includes treatment with Bactrim and Vicodin for pain. Have advised her to return to the ER in 2 days for recheck of her finger or sooner if symptoms appear to be worsening.  Takiesha Mcdevitt L. Trisha Mangleriplett, PA-C 02/26/14 0041  Flint MelterElliott L Wentz, MD 02/26/14 1210  Flint MelterElliott L Wentz, MD 02/26/14 [phone]

## 2014-04-28 ENCOUNTER — Emergency Department (HOSPITAL_COMMUNITY): Payer: Medicare HMO

## 2014-04-28 ENCOUNTER — Encounter (HOSPITAL_COMMUNITY): Payer: Self-pay

## 2014-04-28 ENCOUNTER — Emergency Department (HOSPITAL_COMMUNITY)
Admission: EM | Admit: 2014-04-28 | Discharge: 2014-04-28 | Disposition: A | Discharge status: Home / Self Care | Payer: Medicare HMO | Attending: Emergency Medicine | Admitting: Emergency Medicine

## 2014-04-28 DIAGNOSIS — Z79899 Other long term (current) drug therapy: Secondary | ICD-10-CM | POA: Insufficient documentation

## 2014-04-28 DIAGNOSIS — J40 Bronchitis, not specified as acute or chronic: Secondary | ICD-10-CM | POA: Insufficient documentation

## 2014-04-28 DIAGNOSIS — J209 Acute bronchitis, unspecified: Secondary | ICD-10-CM

## 2014-04-28 DIAGNOSIS — E119 Type 2 diabetes mellitus without complications: Secondary | ICD-10-CM | POA: Diagnosis not present

## 2014-04-28 DIAGNOSIS — I252 Old myocardial infarction: Secondary | ICD-10-CM | POA: Diagnosis not present

## 2014-04-28 DIAGNOSIS — I10 Essential (primary) hypertension: Secondary | ICD-10-CM | POA: Diagnosis not present

## 2014-04-28 DIAGNOSIS — Z7982 Long term (current) use of aspirin: Secondary | ICD-10-CM | POA: Insufficient documentation

## 2014-04-28 DIAGNOSIS — R05 Cough: Secondary | ICD-10-CM | POA: Diagnosis present

## 2014-04-28 MED ORDER — ALBUTEROL SULFATE HFA 108 (90 BASE) MCG/ACT IN AERS
2.0000 | INHALATION_SPRAY | Freq: Once | RESPIRATORY_TRACT | Status: AC
  Administered 2014-04-28: 2 via RESPIRATORY_TRACT
  Filled 2014-04-28: qty 6.7

## 2014-04-28 MED ORDER — GUAIFENESIN-CODEINE 100-10 MG/5ML PO SYRP: 10.0000 mL | ORAL_SOLUTION | Freq: Every evening | ORAL | Status: DC | PRN

## 2014-04-28 MED ORDER — ALBUTEROL SULFATE (2.5 MG/3ML) 0.083% IN NEBU
2.5000 mg | INHALATION_SOLUTION | Freq: Once | RESPIRATORY_TRACT | Status: AC
  Administered 2014-04-28: 2.5 mg via RESPIRATORY_TRACT
  Filled 2014-04-28: qty 3

## 2014-04-28 MED ORDER — AZITHROMYCIN 250 MG PO TABS: 250.0000 mg | ORAL_TABLET | Freq: Every day | ORAL | Status: DC

## 2014-04-28 MED ORDER — DEXAMETHASONE SODIUM PHOSPHATE 4 MG/ML IJ SOLN
10.0000 mg | Freq: Once | INTRAMUSCULAR | Status: AC
  Administered 2014-04-28: 10 mg via INTRAMUSCULAR
  Filled 2014-04-28: qty 3

## 2014-04-28 MED ORDER — IPRATROPIUM-ALBUTEROL 0.5-2.5 (3) MG/3ML IN SOLN
3.0000 mL | Freq: Once | RESPIRATORY_TRACT | Status: AC
  Administered 2014-04-28: 3 mL via RESPIRATORY_TRACT
  Filled 2014-04-28: qty 3

## 2014-04-28 MED ORDER — AZITHROMYCIN 250 MG PO TABS
250.0000 mg | ORAL_TABLET | Freq: Every day | ORAL | Status: DC
Start: 2014-04-28 — End: 2014-04-28

## 2014-04-28 NOTE — Discharge Instructions (Signed)
Acute Bronchitis Bronchitis is when the airways that extend from the windpipe into the lungs get red, puffy, and painful (inflamed). Bronchitis often causes thick spit (mucus) to develop. This leads to a cough. A cough is the most common symptom of bronchitis. In acute bronchitis, the condition usually begins suddenly and goes away over time (usually in 2 weeks). Smoking, allergies, and asthma can make bronchitis worse. Repeated episodes of bronchitis may cause more lung problems. HOME CARE 1. Rest. 2. Drink enough fluids to keep your pee (urine) clear or pale yellow (unless you need to limit fluids as told by your doctor). 3. Only take over-the-counter or prescription medicines as told by your doctor. 4. Avoid smoking and secondhand smoke. These can make bronchitis worse. If you are a smoker, think about using nicotine gum or skin patches. Quitting smoking will help your lungs heal faster. 5. Reduce the chance of getting bronchitis again by: 1. Washing your hands often. 2. Avoiding people with cold symptoms. 3. Trying not to touch your hands to your mouth, nose, or eyes. 6. Follow up with your doctor as told. GET HELP IF: Your symptoms do not improve after 1 week of treatment. Symptoms include:  Cough.  Fever.  Coughing up thick spit.  Body aches.  Chest congestion.  Chills.  Shortness of breath.  Sore throat. GET HELP RIGHT AWAY IF:   You have an increased fever.  You have chills.  You have severe shortness of breath.  You have bloody thick spit (sputum).  You throw up (vomit) often.  You lose too much body fluid (dehydration).  You have a severe headache.  You faint. MAKE SURE YOU:   Understand these instructions.  Will watch your condition.  Will get help right away if you are not doing well or get worse. Document Released: 08/14/2007 Document Revised: 10/28/2012 Document Reviewed: 08/18/2012 [facility] Patient Information 2015 [place], [place]. This  information is not intended to replace advice given to you by your health care provider. Make sure you discuss any questions you have with your health care provider.  Metered Dose Inhaler with Spacer Inhaled medicines are the basis of treatment of asthma and other breathing problems. Inhaled medicine can only be effective if used properly. Good technique assures that the medicine reaches the lungs. Your health care provider has asked you to use a spacer with your inhaler to help you take the medicine more effectively. A spacer is a plastic tube with a mouthpiece on one end and an opening that connects to the inhaler on the other end. Metered dose inhalers (MDIs) are used to deliver a variety of inhaled medicines. These include quick relief or rescue medicines (such as bronchodilators) and controller medicines (such as corticosteroids). The medicine is delivered by pushing down on a metal canister to release a set amount of spray. If you are using different kinds of inhalers, use your quick relief medicine to open the airways 10-15 minutes before using a steroid if instructed to do so by your health care provider. If you are unsure which inhalers to use and the order of using them, ask your health care provider, nurse, or respiratory therapist. HOW TO USE THE INHALER WITH A SPACER 7. Remove cap from inhaler. 8. If you are using the inhaler for the first time, you will need to prime it. Shake the inhaler for 5 seconds and release four puffs into the air, away from your face. Ask your health care provider or pharmacist if you have questions about  priming your inhaler. 9. Shake inhaler for 5 seconds before each breath in (inhalation). 10. Place the open end of the spacer onto the mouthpiece of the inhaler. 11. Position the inhaler so that the top of the canister faces up and the spacer mouthpiece faces you. 12. Put your index finger on the top of the medicine canister. Your thumb supports the bottom of the  inhaler and the spacer. 13. Breathe out (exhale) normally and as completely as possible. 14. Immediately after exhaling, place the spacer between your teeth and into your mouth. Close your mouth tightly around the spacer. 15. Press the canister down with the index finger to release the medicine. 16. At the same time as the canister is pressed, inhale deeply and slowly until the lungs are completely filled. This should take 4-6 seconds. Keep your tongue down and out of the way. 17. Hold the medicine in your lungs for 5-10 seconds (10 seconds is best). This helps the medicine get into the small airways of your lungs. Exhale. 18. Repeat inhaling deeply through the spacer mouthpiece. Again hold that breath for up to 10 seconds (10 seconds is best). Exhale slowly. If it is difficult to take this second deep breath through the spacer, breathe normally several times through the spacer. Remove the spacer from your mouth. 19. Wait at least 15-30 seconds between puffs. Continue with the above steps until you have taken the number of puffs your health care provider has ordered. Do not use the inhaler more than your health care provider directs you to. 20. Remove spacer from the inhaler and place cap on inhaler. 21. Follow the directions from your health care provider or the inhaler insert for cleaning the inhaler and spacer. If you are using a steroid inhaler, rinse your mouth with water after your last puff, gargle, and spit out the water. Do not swallow the water. AVOID:  Inhaling before or after starting the spray of medicine. It takes practice to coordinate your breathing with triggering the spray.  Inhaling through the nose (rather than the mouth) when triggering the spray. HOW TO DETERMINE IF YOUR INHALER IS FULL OR NEARLY EMPTY You cannot know when an inhaler is empty by shaking it. A few inhalers are now being made with dose counters. Ask your health care provider for a prescription that has a dose  counter if you feel you need that extra help. If your inhaler does not have a counter, ask your health care provider to help you determine the date you need to refill your inhaler. Write the refill date on a calendar or your inhaler canister. Refill your inhaler 7-10 days before it runs out. Be sure to keep an adequate supply of medicine. This includes making sure it is not expired, and you have a spare inhaler.  SEEK MEDICAL CARE IF:   Symptoms are only partially relieved with your inhaler.  You are having trouble using your inhaler.  You experience some increase in phlegm. SEEK IMMEDIATE MEDICAL CARE IF:   You feel little or no relief with your inhalers. You are still wheezing and are feeling shortness of breath or tightness in your chest or both.  You have dizziness, headaches, or fast heart rate.  You have chills, fever, or night sweats.  There is a noticeable increase in phlegm production, or there is blood in the phlegm. Document Released: 02/25/2005 Document Revised: 07/12/2013 Document Reviewed: 08/13/2012 [facility] Patient Information 2015 [place], [place]. This information is not intended to replace advice given  to you by your health care provider. Make sure you discuss any questions you have with your health care provider.

## 2014-04-28 NOTE — ED Notes (Signed)
Pt c/o sinus and chest congestion x 1 month.  Denies fever.

## 2014-05-01 NOTE — ED Provider Notes (Signed)
CSN: [id number]     Arrival date & time 04/28/14  1814 History   First MD Initiated Contact with Patient 04/28/14 2048     Chief Complaint  Patient presents with  . Cough     (Consider location/radiation/quality/duration/timing/severity/associated sxs/prior Treatment) HPI   [name] is a 69 y.o. female who presents to the Emergency Department complaining of persistent cough, nasal congestion, and chest congestion.  She states the symptoms have persisted for one month, but worsened recently.  She reports productive cough and wheezes.  She reports worsening sx's with lying down or exertion.  She has tried OTC cough medications without relief.  She denies chest pain, shortness of breath, fever, abdominal pain or vomiting.   Past Medical History  Diagnosis Date  . Hypertension   . Diabetes   . High cholesterol   . Myocardial infarction 2004.   Past Surgical History  Procedure Laterality Date  . Colonoscopy  02/14/09    YQM:VHQIONGSLF:sigmoid colon polyp/small internal hemorrhoids, benign polyp  . Back surgery    . Abdominal hysterectomy    . Neck surgery    . Colonoscopy  02/14/2009    SLF:3-mm sessile sigmoid colon polyp/frequent ascending colon and diverticula/small internal hemorrhoids  . Esophagogastroduodenoscopy N/A 02/26/2013    Procedure: ESOPHAGOGASTRODUODENOSCOPY (EGD);  Surgeon: West BaliSandi L Fields, MD;  Location: [facility];  Service: Endoscopy;  Laterality: N/A;  9:30   Family History  Problem Relation Age of Onset  . Colon cancer Neg Hx    History  Substance Use Topics  . Smoking status: Never Smoker   . Smokeless tobacco: Not on file  . Alcohol Use: No   OB History    No data available     Review of Systems  Constitutional: Negative for fever, chills and appetite change.  HENT: Positive for congestion. Negative for sore throat and trouble swallowing.   Respiratory: Positive for cough and wheezing. Negative for chest tightness and shortness of breath.    Cardiovascular: Negative for chest pain.  Gastrointestinal: Negative for nausea, vomiting and abdominal pain.  Genitourinary: Negative for dysuria.  Musculoskeletal: Negative for arthralgias.  Skin: Negative for rash.  Neurological: Negative for dizziness, weakness and numbness.  Hematological: Negative for adenopathy.  All other systems reviewed and are negative.     Allergies  Review of patient's allergies indicates no known allergies.  Home Medications   Prior to Admission medications   Medication Sig Start Date End Date Taking? Authorizing Provider  aspirin EC 81 MG tablet Take 81 mg by mouth daily.   Yes Historical Provider, MD  ferrous sulfate 325 (65 FE) MG tablet Take 325 mg by mouth daily with breakfast.   Yes Historical Provider, MD  glipiZIDE-metformin (METAGLIP) 5-500 MG per tablet Take 1 tablet by mouth 2 (two) times daily before a meal.  06/25/12  Yes Historical Provider, MD  hydrochlorothiazide (HYDRODIURIL) 25 MG tablet Take 25 mg by mouth daily.  06/17/12  Yes Historical Provider, MD  omeprazole (PRILOSEC) 20 MG capsule 1 PO EVERY MORNING Patient taking differently: Take 20 mg by mouth every morning. 1 PO EVERY MORNING 02/26/13  Yes West BaliSandi L Fields, MD  pioglitazone (ACTOS) 45 MG tablet Take 45 mg by mouth daily. 02/17/14  Yes Historical Provider, MD  quinapril (ACCUPRIL) 40 MG tablet Take 40 mg by mouth daily. 02/08/14  Yes Historical Provider, MD  azithromycin (ZITHROMAX) 250 MG tablet Take 1 tablet (250 mg total) by mouth daily. Take first 2 tablets together, then 1 every day  until finished. 04/28/14   Gagandeep Kossman L. Josely Moffat, PA-C  guaiFENesin-codeine (ROBITUSSIN AC) 100-10 MG/5ML syrup Take 10 mLs by mouth at bedtime as needed. 04/28/14   Reatha Sur L. Rihan Schueler, PA-C   BP 134/60 mmHg  Pulse 106  Temp(Src) 98.4 F (36.9 C) (Oral)  Resp 18  Ht  (1.549 m)  Wt 134 lb (60.782 kg)  BMI 25.33 kg/m2  SpO2 97% Physical Exam  Constitutional: She is oriented to person, place,  and time. She appears well-developed and well-nourished. No distress.  HENT:  Head: Normocephalic and atraumatic.  Right Ear: Tympanic membrane and ear canal normal.  Left Ear: Tympanic membrane and ear canal normal.  Mouth/Throat: Uvula is midline, oropharynx is clear and moist and mucous membranes are normal. No oropharyngeal exudate.  Eyes: EOM are normal. Pupils are equal, round, and reactive to light.  Neck: Normal range of motion, full passive range of motion without pain and phonation normal. Neck supple.  Cardiovascular: Normal rate, regular rhythm, normal heart sounds and intact distal pulses.   No murmur heard. Pulmonary/Chest: Effort normal. No stridor. No respiratory distress. She has wheezes. She has no rales. She exhibits no tenderness.  Coarse lungs sounds bilaterally, inspiratory and expiratory wheezes bilaterally.  Musculoskeletal: She exhibits no edema.  Lymphadenopathy:    She has no cervical adenopathy.  Neurological: She is alert and oriented to person, place, and time. She exhibits normal muscle tone. Coordination normal.  Skin: Skin is warm and dry.  Nursing note and vitals reviewed.   ED Course  Procedures (including critical care time) Labs Review Labs Reviewed - No data to display  Imaging Review Dg Chest 2 View  04/28/2014   CLINICAL DATA:  Sinus and chest congestion  EXAM: CHEST  2 VIEW  COMPARISON:  12/28/2013  FINDINGS: Heart size is normal. Atherosclerotic plaque noted within the aortic arch. No pleural effusion or edema. No airspace consolidation identified. Mild spondylosis noted within the thoracic spine.  IMPRESSION: 1. No acute cardiopulmonary abnormalities.   Electronically Signed   By: Signa Kell M.D.   On: 04/28/2014 19:02     EKG Interpretation None      MDM   Final diagnoses:  Bronchitis with bronchospasm    Pt is well appearing, no respiratory distress.  Lung sounds improved after neb,  IM steroid.  No hypoxia, tachypnea or  tachycardia.  Albuterol inhaler dispensed.  She appears stable for discharge, agrees to close f/u with her PMD.  Return precautions given    Hershel Corkery L. Trisha Mangle, PA-C 05/01/14 0046  Samuel Jester, DO 05/01/14 [phone]

## 2014-09-02 ENCOUNTER — Emergency Department (HOSPITAL_COMMUNITY)
Admission: EM | Admit: 2014-09-02 | Discharge: 2014-09-03 | Disposition: A | Discharge status: Home / Self Care | Payer: Medicare HMO | Attending: Emergency Medicine | Admitting: Emergency Medicine

## 2014-09-02 ENCOUNTER — Encounter (HOSPITAL_COMMUNITY): Payer: Self-pay | Admitting: Emergency Medicine

## 2014-09-02 DIAGNOSIS — I252 Old myocardial infarction: Secondary | ICD-10-CM | POA: Insufficient documentation

## 2014-09-02 DIAGNOSIS — E119 Type 2 diabetes mellitus without complications: Secondary | ICD-10-CM | POA: Diagnosis not present

## 2014-09-02 DIAGNOSIS — L02214 Cutaneous abscess of groin: Secondary | ICD-10-CM | POA: Diagnosis present

## 2014-09-02 DIAGNOSIS — Z7982 Long term (current) use of aspirin: Secondary | ICD-10-CM | POA: Diagnosis not present

## 2014-09-02 DIAGNOSIS — I10 Essential (primary) hypertension: Secondary | ICD-10-CM | POA: Diagnosis not present

## 2014-09-02 DIAGNOSIS — Z79899 Other long term (current) drug therapy: Secondary | ICD-10-CM | POA: Diagnosis not present

## 2014-09-02 DIAGNOSIS — M549 Dorsalgia, unspecified: Secondary | ICD-10-CM | POA: Diagnosis not present

## 2014-09-02 DIAGNOSIS — R5383 Other fatigue: Secondary | ICD-10-CM | POA: Insufficient documentation

## 2014-09-02 MED ORDER — LIDOCAINE-PRILOCAINE 2.5-2.5 % EX CREA
TOPICAL_CREAM | Freq: Once | CUTANEOUS | Status: AC
  Administered 2014-09-02: 21:00:00 via TOPICAL
  Filled 2014-09-02: qty 5

## 2014-09-02 MED ORDER — OXYCODONE-ACETAMINOPHEN 5-325 MG PO TABS: 1.0000 | ORAL_TABLET | Freq: Four times a day (QID) | ORAL | Status: DC | PRN

## 2014-09-02 MED ORDER — DOXYCYCLINE HYCLATE 100 MG PO CAPS: 100.0000 mg | ORAL_CAPSULE | Freq: Two times a day (BID) | ORAL | Status: DC

## 2014-09-02 MED ORDER — ONDANSETRON HCL 4 MG PO TABS
4.0000 mg | ORAL_TABLET | Freq: Once | ORAL | Status: AC
  Administered 2014-09-02: 4 mg via ORAL
  Filled 2014-09-02: qty 1

## 2014-09-02 MED ORDER — LIDOCAINE HCL (PF) 2 % IJ SOLN
10.0000 mL | Freq: Once | INTRAMUSCULAR | Status: AC
  Administered 2014-09-02: 10 mL
  Filled 2014-09-02: qty 10

## 2014-09-02 MED ORDER — DOXYCYCLINE HYCLATE 100 MG PO TABS
100.0000 mg | ORAL_TABLET | Freq: Once | ORAL | Status: AC
  Administered 2014-09-02: 100 mg via ORAL
  Filled 2014-09-02: qty 1

## 2014-09-02 MED ORDER — OXYCODONE-ACETAMINOPHEN 5-325 MG PO TABS
1.0000 | ORAL_TABLET | Freq: Once | ORAL | Status: AC
  Administered 2014-09-02: 1 via ORAL
  Filled 2014-09-02: qty 1

## 2014-09-02 NOTE — ED Notes (Signed)
Patient reports she noticed to boils to right thigh on Wednesday and the swelling and soreness has worsened.

## 2014-09-02 NOTE — Discharge Instructions (Signed)
Starting tomorrow, June 25, please begin warm Epsom salt tub soaks for 15-20 minutes. Please change the dressing daily until the wound has healed from the inside out. Please use doxycycline 2 times daily with food. Use Tylenol or ibuprofen for mild pain, use Percocet for more severe pain. Percocet may cause drowsiness, and/or constipation. Please use this medication with caution. Please see your physician, or return to the emergency department if any changes, problems, or concerns. Abscess An abscess (boil or furuncle) is an infected area on or under the skin. This area is filled with yellowish-white fluid (pus) and other material (debris). HOME CARE   Only take medicines as told by your doctor.  If you were given antibiotic medicine, take it as directed. Finish the medicine even if you start to feel better.  If gauze is used, follow your doctor's directions for changing the gauze.  To avoid spreading the infection:  Keep your abscess covered with a bandage.  Wash your hands well.  Do not share personal care items, towels, or whirlpools with others.  Avoid skin contact with others.  Keep your skin and clothes clean around the abscess.  Keep all doctor visits as told. GET HELP RIGHT AWAY IF:   You have more pain, puffiness (swelling), or redness in the wound site.  You have more fluid or blood coming from the wound site.  You have muscle aches, chills, or you feel sick.  You have a fever. MAKE SURE YOU:   Understand these instructions.  Will watch your condition.  Will get help right away if you are not doing well or get worse. Document Released: 08/14/2007 Document Revised: 08/27/2011 Document Reviewed: 05/10/2011 [facility] Patient Information 2015 [place], [place]. This information is not intended to replace advice given to you by your health care provider. Make sure you discuss any questions you have with your health care provider. Were started and or

## 2014-09-02 NOTE — ED Provider Notes (Signed)
CSN: [id number]     Arrival date & time 09/02/14  1943 History   First MD Initiated Contact with Patient 09/02/14 2046     Chief Complaint  Patient presents with  . Abscess     (Consider location/radiation/quality/duration/timing/severity/associated sxs/prior Treatment) HPI Comments: Patient is a 69 year old female who presents to the emergency department with a complaint of a boil on the inner thigh.  The patient states that this problem began on Tuesday, June 21. The patient states that the problems been getting progressively worse. She now has pain when she attempts to walk or sit. She states she has had episodes where she has not been feeling good, but she has not measured a temperature elevation. States she cannot see the area, so she does not know which there has been any red streaking. She does mention that she is diabetic.  Patient is a 69 y.o. female presenting with abscess. The history is provided by the patient.  Abscess Location:  Ano-genital Ano-genital abscess location:  Groin Associated symptoms: fatigue   Associated symptoms: no fever     Past Medical History  Diagnosis Date  . Hypertension   . Diabetes   . High cholesterol   . Myocardial infarction 2004.   Past Surgical History  Procedure Laterality Date  . Colonoscopy  02/14/09    [id number] colon polyp/small internal hemorrhoids, benign polyp  . Back surgery    . Abdominal hysterectomy    . Neck surgery    . Colonoscopy  02/14/2009    SLF:3-mm sessile sigmoid colon polyp/frequent ascending colon and diverticula/small internal hemorrhoids  . Esophagogastroduodenoscopy N/A 02/26/2013    Procedure: ESOPHAGOGASTRODUODENOSCOPY (EGD);  Surgeon: West Bali, MD;  Location: [facility];  Service: Endoscopy;  Laterality: N/A;  9:30   Family History  Problem Relation Age of Onset  . Colon cancer Neg Hx    History  Substance Use Topics  . Smoking status: Never Smoker   . Smokeless tobacco: Not on file  .  Alcohol Use: No   OB History    No data available     Review of Systems  Constitutional: Positive for fatigue. Negative for fever, activity change and appetite change.       All ROS Neg except as noted in HPI  HENT: Negative for nosebleeds.   Eyes: Negative for photophobia and discharge.  Respiratory: Negative for cough, shortness of breath and wheezing.   Cardiovascular: Negative for chest pain and palpitations.  Gastrointestinal: Negative for abdominal pain and blood in stool.  Genitourinary: Negative for dysuria, frequency and hematuria.  Musculoskeletal: Positive for back pain. Negative for arthralgias and neck pain.  Skin: Positive for wound.  Neurological: Negative for dizziness, seizures and speech difficulty.  Psychiatric/Behavioral: Negative for hallucinations and confusion.  All other systems reviewed and are negative.     Allergies  Review of patient's allergies indicates no known allergies.  Home Medications   Prior to Admission medications   Medication Sig Start Date End Date Taking? Authorizing Provider  aspirin EC 81 MG tablet Take 81 mg by mouth daily.    Historical Provider, MD  azithromycin (ZITHROMAX) 250 MG tablet Take 1 tablet (250 mg total) by mouth daily. Take first 2 tablets together, then 1 every day until finished. 04/28/14   Tammy Triplett, PA-C  ferrous sulfate 325 (65 FE) MG tablet Take 325 mg by mouth daily with breakfast.    Historical Provider, MD  glipiZIDE-metformin (METAGLIP) 5-500 MG per tablet Take 1 tablet by mouth 2 (  two) times daily before a meal.  06/25/12   Historical Provider, MD  guaiFENesin-codeine (ROBITUSSIN AC) 100-10 MG/5ML syrup Take 10 mLs by mouth at bedtime as needed. 04/28/14   Tammy Triplett, PA-C  hydrochlorothiazide (HYDRODIURIL) 25 MG tablet Take 25 mg by mouth daily.  06/17/12   Historical Provider, MD  omeprazole (PRILOSEC) 20 MG capsule 1 PO EVERY MORNING Patient taking differently: Take 20 mg by mouth every morning. 1 PO  EVERY MORNING 02/26/13   West Bali, MD  pioglitazone (ACTOS) 45 MG tablet Take 45 mg by mouth daily. 02/17/14   Historical Provider, MD  quinapril (ACCUPRIL) 40 MG tablet Take 40 mg by mouth daily. 02/08/14   Historical Provider, MD   BP 160/102 mmHg  Pulse 107  Temp(Src) 97.8 F (36.6 C) (Oral)  Resp 18  Ht  (1.549 m)  Wt 138 lb (62.596 kg)  BMI 26.09 kg/m2  SpO2 100% Physical Exam  Constitutional: She is oriented to person, place, and time. She appears well-developed and well-nourished.  Non-toxic appearance.  HENT:  Head: Normocephalic.  Right Ear: Tympanic membrane and external ear normal.  Left Ear: Tympanic membrane and external ear normal.  Eyes: EOM and lids are normal. Pupils are equal, round, and reactive to light.  Neck: Normal range of motion. Neck supple. Carotid bruit is not present.  Cardiovascular: Regular rhythm, normal heart sounds, intact distal pulses and normal pulses.  Tachycardia present.   Pulmonary/Chest: Breath sounds normal. No respiratory distress.  Abdominal: Soft. Bowel sounds are normal. There is no tenderness. There is no guarding.  Genitourinary:     Musculoskeletal: Normal range of motion.  Lymphadenopathy:       Head (right side): No submandibular adenopathy present.       Head (left side): No submandibular adenopathy present.    She has no cervical adenopathy.  Neurological: She is alert and oriented to person, place, and time. She has normal strength. No cranial nerve deficit or sensory deficit.  Skin: Skin is warm and dry.  Psychiatric: She has a normal mood and affect. Her speech is normal.  Nursing note and vitals reviewed.   ED Course  Patient requests to use of EMLA cream. Patient aware of needing to leave the cream in from 45 minutes to an hour, and she is in agreement with this plan.   INCISION AND DRAINAGE Date/Time: 09/03/2014 11:15 AM Performed by: Ivery Quale Authorized by: Ivery Quale Consent: Verbal consent  obtained. Risks and benefits: risks, benefits and alternatives were discussed Consent given by: patient Patient understanding: patient states understanding of the procedure being performed Patient identity confirmed: arm band Time out: Immediately prior to procedure a "time out" was called to verify the correct patient, procedure, equipment, support staff and site/side marked as required. Type: abscess Location: right groin. Anesthesia: local infiltration Local anesthetic: lidocaine 2% without epinephrine Patient sedated: no Scalpel size: 11 Incision type: single with marsupialization and  single straight Complexity: simple Drainage: purulent Drainage amount: moderate Wound treatment: wound left open Patient tolerance: Patient tolerated the procedure well with no immediate complications   (including critical care time) Labs Review Labs Reviewed - No data to display  Imaging Review No results found.   EKG Interpretation None      MDM  Vital signs are within normal limits with exception of the blood pressure being slightly elevated. The pulse oximetry is 100% on room air. Within normal limits by my interpretation. Patient has an abscess of the right groin/upper thigh. Incision  and drainage carried out. Culture sent to the lab. Patient tolerated the procedure without problem. Patient will be treated with doxycycline and Percocet. I've asked the patient to begin warm tub soaks on tomorrow. The patient is to see her primary physician, or return to the emergency department immediately if any changes, problems, or concerns. The patient is in agreement with this discharge plan.    Final diagnoses:  None    *I have reviewed nursing notes, vital signs, and all appropriate lab and imaging results for this patient.[address], PA-C 09/03/14 1119  Bethann Berkshire, MD 09/05/14 [phone]

## 2014-09-06 ENCOUNTER — Telehealth (HOSPITAL_BASED_OUTPATIENT_CLINIC_OR_DEPARTMENT_OTHER): Payer: Self-pay | Admitting: Emergency Medicine

## 2014-09-06 LAB — CULTURE, ROUTINE-ABSCESS

## 2014-09-07 ENCOUNTER — Telehealth (HOSPITAL_COMMUNITY): Payer: Self-pay

## 2014-09-07 NOTE — Telephone Encounter (Signed)
Post ED Visit - Positive Culture Follow-up  Culture report reviewed by antimicrobial stewardship pharmacist: []  Wes Dulaney, Pharm.D., BCPS [x]  Celedonio MiyamotoJeremy Frens, Pharm.D., BCPS []  Georgina PillionElizabeth Martin, Pharm.D., BCPS []  CarpenterMinh Pham, [address]Ph[address]D., BCPS, AAHIVP []  Estella HuskMichelle Turner, Pharm.D., BCPS, AAHIVP []  Elder CyphersLorie Poole, [address]Pharm.D., BCPS  Positive wound culture Treated with doxycycline, organism sensitive to the same and no further patient follow-up is required at this time.  Ashley JacobsFesterman, Justin Meisenheimer C 09/07/2014, 5:16 PM

## 2015-01-24 ENCOUNTER — Other Ambulatory Visit (HOSPITAL_COMMUNITY): Payer: Self-pay | Admitting: Physician Assistant

## 2015-01-24 DIAGNOSIS — Z1231 Encounter for screening mammogram for malignant neoplasm of breast: Secondary | ICD-10-CM

## 2015-02-01 ENCOUNTER — Ambulatory Visit (HOSPITAL_COMMUNITY)
Admission: RE | Admit: 2015-02-01 | Discharge: 2015-02-01 | Disposition: A | Discharge status: Home / Self Care | Payer: Medicare HMO | Source: Ambulatory Visit | Attending: Physician Assistant | Admitting: Physician Assistant

## 2015-02-01 DIAGNOSIS — Z1231 Encounter for screening mammogram for malignant neoplasm of breast: Secondary | ICD-10-CM | POA: Insufficient documentation

## 2015-03-04 ENCOUNTER — Inpatient Hospital Stay (HOSPITAL_COMMUNITY)
Admission: EM | Admit: 2015-03-04 | Discharge: 2015-03-05 | DRG: 378 | Disposition: A | Discharge status: Home / Self Care | Payer: Medicare HMO | Attending: Internal Medicine | Admitting: Internal Medicine

## 2015-03-04 ENCOUNTER — Encounter (HOSPITAL_COMMUNITY): Payer: Self-pay | Admitting: *Deleted

## 2015-03-04 ENCOUNTER — Emergency Department (HOSPITAL_COMMUNITY): Payer: Medicare HMO

## 2015-03-04 ENCOUNTER — Encounter (HOSPITAL_COMMUNITY)
Admission: EM | Disposition: A | Discharge status: Home / Self Care | Payer: Self-pay | Source: Home / Self Care | Attending: Internal Medicine

## 2015-03-04 DIAGNOSIS — E119 Type 2 diabetes mellitus without complications: Secondary | ICD-10-CM | POA: Diagnosis present

## 2015-03-04 DIAGNOSIS — Z7984 Long term (current) use of oral hypoglycemic drugs: Secondary | ICD-10-CM | POA: Diagnosis not present

## 2015-03-04 DIAGNOSIS — E118 Type 2 diabetes mellitus with unspecified complications: Secondary | ICD-10-CM

## 2015-03-04 DIAGNOSIS — K921 Melena: Secondary | ICD-10-CM | POA: Diagnosis present

## 2015-03-04 DIAGNOSIS — E785 Hyperlipidemia, unspecified: Secondary | ICD-10-CM | POA: Diagnosis present

## 2015-03-04 DIAGNOSIS — E7800 Pure hypercholesterolemia, unspecified: Secondary | ICD-10-CM | POA: Diagnosis present

## 2015-03-04 DIAGNOSIS — I10 Essential (primary) hypertension: Secondary | ICD-10-CM | POA: Diagnosis present

## 2015-03-04 DIAGNOSIS — Z86010 Personal history of colonic polyps: Secondary | ICD-10-CM | POA: Diagnosis not present

## 2015-03-04 DIAGNOSIS — K5791 Diverticulosis of intestine, part unspecified, without perforation or abscess with bleeding: Secondary | ICD-10-CM | POA: Diagnosis not present

## 2015-03-04 DIAGNOSIS — K5731 Diverticulosis of large intestine without perforation or abscess with bleeding: Principal | ICD-10-CM | POA: Diagnosis present

## 2015-03-04 DIAGNOSIS — I252 Old myocardial infarction: Secondary | ICD-10-CM

## 2015-03-04 DIAGNOSIS — D62 Acute posthemorrhagic anemia: Secondary | ICD-10-CM | POA: Diagnosis present

## 2015-03-04 DIAGNOSIS — K922 Gastrointestinal hemorrhage, unspecified: Secondary | ICD-10-CM | POA: Diagnosis present

## 2015-03-04 DIAGNOSIS — Z7982 Long term (current) use of aspirin: Secondary | ICD-10-CM | POA: Diagnosis not present

## 2015-03-04 HISTORY — PX: COLONOSCOPY: SHX5424

## 2015-03-04 LAB — CBC WITH DIFFERENTIAL/PLATELET
Basophils Absolute: 0 10*3/uL (ref 0.0–0.1)
Basophils Relative: 0 %
Eosinophils Absolute: 0.2 10*3/uL (ref 0.0–0.7)
Eosinophils Relative: 2 %
HCT: 32.5 % — ABNORMAL LOW (ref 36.0–46.0)
Hemoglobin: 10.6 g/dL — ABNORMAL LOW (ref 12.0–15.0)
Lymphocytes Relative: 35 %
Lymphs Abs: 2.7 10*3/uL (ref 0.7–4.0)
MCH: 30.2 pg (ref 26.0–34.0)
MCHC: 32.6 g/dL (ref 30.0–36.0)
MCV: 92.6 fL (ref 78.0–100.0)
Monocytes Absolute: 0.5 10*3/uL (ref 0.1–1.0)
Monocytes Relative: 7 %
Neutro Abs: 4.2 10*3/uL (ref 1.7–7.7)
Neutrophils Relative %: 56 %
Platelets: 365 10*3/uL (ref 150–400)
RBC: 3.51 MIL/uL — ABNORMAL LOW (ref 3.87–5.11)
RDW: 14.2 % (ref 11.5–15.5)
WBC: 7.6 10*3/uL (ref 4.0–10.5)

## 2015-03-04 LAB — BASIC METABOLIC PANEL
Anion gap: 10 (ref 5–15)
BUN: 21 mg/dL — ABNORMAL HIGH (ref 6–20)
CO2: 24 mmol/L (ref 22–32)
Calcium: 9 mg/dL (ref 8.9–10.3)
Chloride: 103 mmol/L (ref 101–111)
Creatinine, Ser: 0.83 mg/dL (ref 0.44–1.00)
GFR calc Af Amer: >60 mL/min (ref >60)
GFR calc non Af Amer: >60 mL/min (ref >60)
Glucose, Bld: 173 mg/dL — ABNORMAL HIGH (ref 65–99)
Potassium: 4 mmol/L (ref 3.5–5.1)
Sodium: 137 mmol/L (ref 135–145)

## 2015-03-04 LAB — CBC
HCT: 25.2 % — ABNORMAL LOW (ref 36.0–46.0)
HCT: 20.4 % — ABNORMAL LOW (ref 36.0–46.0)
Hemoglobin: 8.3 g/dL — ABNORMAL LOW (ref 12.0–15.0)
Hemoglobin: 6.7 g/dL — CL (ref 12.0–15.0)
MCH: 30.6 pg (ref 26.0–34.0)
MCH: 30.5 pg (ref 26.0–34.0)
MCHC: 32.9 g/dL (ref 30.0–36.0)
MCHC: 32.8 g/dL (ref 30.0–36.0)
MCV: 93 fL (ref 78.0–100.0)
MCV: 92.7 fL (ref 78.0–100.0)
Platelets: 296 10*3/uL (ref 150–400)
Platelets: 230 10*3/uL (ref 150–400)
RBC: 2.71 MIL/uL — ABNORMAL LOW (ref 3.87–5.11)
RBC: 2.2 MIL/uL — ABNORMAL LOW (ref 3.87–5.11)
RDW: 14.2 % (ref 11.5–15.5)
RDW: 14.1 % (ref 11.5–15.5)
WBC: 9 10*3/uL (ref 4.0–10.5)
WBC: 7.6 10*3/uL (ref 4.0–10.5)

## 2015-03-04 LAB — GLUCOSE, CAPILLARY
Glucose-Capillary: 119 mg/dL — ABNORMAL HIGH (ref 65–99)
Glucose-Capillary: 104 mg/dL — ABNORMAL HIGH (ref 65–99)

## 2015-03-04 LAB — PREPARE RBC (CROSSMATCH)

## 2015-03-04 SURGERY — COLONOSCOPY
Anesthesia: Moderate Sedation

## 2015-03-04 MED ORDER — ACETAMINOPHEN 650 MG RE SUPP: 650.0000 mg | Freq: Four times a day (QID) | RECTAL | Status: DC | PRN

## 2015-03-04 MED ORDER — SODIUM CHLORIDE 0.9 % IV SOLN
Freq: Once | INTRAVENOUS | Status: AC
  Administered 2015-03-05: 01:00:00 via INTRAVENOUS

## 2015-03-04 MED ORDER — ZOLPIDEM TARTRATE 5 MG PO TABS
5.0000 mg | ORAL_TABLET | Freq: Every evening | ORAL | Status: DC | PRN
  Administered 2015-03-04: 5 mg via ORAL
  Filled 2015-03-04: qty 1

## 2015-03-04 MED ORDER — ONDANSETRON HCL 4 MG/2ML IJ SOLN
INTRAMUSCULAR | Status: AC
  Filled 2015-03-04: qty 2

## 2015-03-04 MED ORDER — SODIUM CHLORIDE 0.9 % IV BOLUS (SEPSIS)
1000.0000 mL | Freq: Once | INTRAVENOUS | Status: AC
  Administered 2015-03-04: 1000 mL via INTRAVENOUS

## 2015-03-04 MED ORDER — PEG 3350-KCL-NA BICARB-NACL 420 G PO SOLR
2000.0000 mL | Freq: Once | ORAL | Status: AC
  Administered 2015-03-04: 2000 mL via ORAL
  Filled 2015-03-04: qty 4000

## 2015-03-04 MED ORDER — MIDAZOLAM HCL 5 MG/5ML IJ SOLN
INTRAMUSCULAR | Status: AC
  Filled 2015-03-04: qty 10

## 2015-03-04 MED ORDER — SODIUM CHLORIDE 0.9 % IV SOLN: INTRAVENOUS | Status: DC

## 2015-03-04 MED ORDER — INSULIN ASPART 100 UNIT/ML ~~LOC~~ SOLN
0.0000 [IU] | SUBCUTANEOUS | Status: DC
  Administered 2015-03-05: 2 [IU] via SUBCUTANEOUS

## 2015-03-04 MED ORDER — ONDANSETRON HCL 4 MG PO TABS: 4.0000 mg | ORAL_TABLET | Freq: Four times a day (QID) | ORAL | Status: DC | PRN

## 2015-03-04 MED ORDER — MEPERIDINE HCL 100 MG/ML IJ SOLN
INTRAMUSCULAR | Status: DC | PRN
  Administered 2015-03-04 (×2): 25 mg via INTRAVENOUS

## 2015-03-04 MED ORDER — IOHEXOL 300 MG/ML  SOLN
100.0000 mL | Freq: Once | INTRAMUSCULAR | Status: AC | PRN
  Administered 2015-03-04: 100 mL via INTRAVENOUS

## 2015-03-04 MED ORDER — ACETAMINOPHEN 325 MG PO TABS: 650.0000 mg | ORAL_TABLET | Freq: Four times a day (QID) | ORAL | Status: DC | PRN

## 2015-03-04 MED ORDER — SODIUM CHLORIDE 0.9 % IJ SOLN
3.0000 mL | Freq: Two times a day (BID) | INTRAMUSCULAR | Status: DC
  Administered 2015-03-05: 3 mL via INTRAVENOUS

## 2015-03-04 MED ORDER — STERILE WATER FOR IRRIGATION IR SOLN
Status: DC | PRN
  Administered 2015-03-04: 18:00:00

## 2015-03-04 MED ORDER — ONDANSETRON HCL 4 MG/2ML IJ SOLN
INTRAMUSCULAR | Status: DC | PRN
  Administered 2015-03-04: 4 mg via INTRAVENOUS

## 2015-03-04 MED ORDER — SODIUM CHLORIDE 0.9 % IV SOLN
INTRAVENOUS | Status: DC
  Administered 2015-03-04 (×2): via INTRAVENOUS

## 2015-03-04 MED ORDER — MIDAZOLAM HCL 5 MG/5ML IJ SOLN
INTRAMUSCULAR | Status: DC | PRN
  Administered 2015-03-04 (×4): 1 mg via INTRAVENOUS
  Administered 2015-03-04: 2 mg via INTRAVENOUS
  Administered 2015-03-04 (×2): 1 mg via INTRAVENOUS

## 2015-03-04 MED ORDER — MEPERIDINE HCL 100 MG/ML IJ SOLN
INTRAMUSCULAR | Status: AC
  Filled 2015-03-04: qty 2

## 2015-03-04 MED ORDER — PEG 3350-KCL-NA BICARB-NACL 420 G PO SOLR
4000.0000 mL | Freq: Once | ORAL | Status: AC
  Administered 2015-03-04: 4000 mL via ORAL
  Filled 2015-03-04: qty 4000

## 2015-03-04 MED ORDER — ONDANSETRON HCL 4 MG/2ML IJ SOLN: 4.0000 mg | Freq: Four times a day (QID) | INTRAMUSCULAR | Status: DC | PRN

## 2015-03-04 MED ORDER — ATORVASTATIN CALCIUM 40 MG PO TABS
40.0000 mg | ORAL_TABLET | Freq: Every day | ORAL | Status: DC
  Administered 2015-03-04 – 2015-03-05 (×2): 40 mg via ORAL
  Filled 2015-03-04 (×2): qty 1

## 2015-03-04 NOTE — Consult Note (Signed)
Referring Provider: No ref. provider found Primary Care Physician:  Phyllis Ginger Primary Gastroenterologist:  Dr.Fields  Reason for Consultation:  Hematochezia.   HPI:   Amber 69 year old Schwartz was doing well until earlier today when she started expressing multiple episodes of gross bloody dark red bowel movements without any abdominal pain, whatsoever. She presented to the emergency department for further evaluation. She was found to be hemodynamically stable to hemoglobin of 10.6.  Patient is on no antiplatelet/anticoagulation therapy aside from an 81 mg aspirin daily. Prior colonoscopy a few years ago demonstrated predominantly right sided diverticula. CT this admission demonstrated high attenuation liquid coming from a focal point in the ascending segment likely representing a diverticular bleed.  Past Medical History  Diagnosis Date  . Hypertension   . Diabetes (HCC)   . High cholesterol   . Myocardial infarction (HCC) 2004.    Past Surgical History  Procedure Laterality Date  . Colonoscopy  02/14/09    [id number] colon polyp/small internal hemorrhoids, benign polyp  . Back surgery    . Abdominal hysterectomy    . Neck surgery    . Colonoscopy  02/14/2009    SLF:3-mm sessile sigmoid colon polyp/frequent ascending colon and diverticula/small internal hemorrhoids  . Esophagogastroduodenoscopy N/A 02/26/2013    Procedure: ESOPHAGOGASTRODUODENOSCOPY (EGD);  Surgeon: West Bali, MD;  Location: [facility];  Service: Endoscopy;  Laterality: N/A;  9:30    Prior to Admission medications   Medication Sig Start Date End Date Taking? Authorizing Provider  aspirin EC 81 MG tablet Take 81 mg by mouth daily.   Yes Historical Provider, MD  atorvastatin (LIPITOR) 40 MG tablet Take 40 mg by mouth daily.   Yes Historical Provider, MD  glipiZIDE (GLUCOTROL) 10 MG tablet Take 10 mg by mouth daily before breakfast.  02/04/15  Yes Historical Provider, MD    hydrochlorothiazide (HYDRODIURIL) 25 MG tablet Take 25 mg by mouth daily.  06/17/12  Yes Historical Provider, MD  metFORMIN (GLUCOPHAGE) 1000 MG tablet Take 1,000 mg by mouth daily with breakfast.  02/04/15  Yes Historical Provider, MD  pioglitazone (ACTOS) 45 MG tablet Take 45 mg by mouth daily. 02/17/14  Yes Historical Provider, MD  quinapril (ACCUPRIL) 40 MG tablet Take 40 mg by mouth daily. 02/08/14  Yes Historical Provider, MD    No current facility-administered medications for this encounter.   Current Outpatient Prescriptions  Medication Sig Dispense Refill  . aspirin EC 81 MG tablet Take 81 mg by mouth daily.    Marland Kitchen atorvastatin (LIPITOR) 40 MG tablet Take 40 mg by mouth daily.    Marland Kitchen glipiZIDE (GLUCOTROL) 10 MG tablet Take 10 mg by mouth daily before breakfast.     . hydrochlorothiazide (HYDRODIURIL) 25 MG tablet Take 25 mg by mouth daily.     . metFORMIN (GLUCOPHAGE) 1000 MG tablet Take 1,000 mg by mouth daily with breakfast.     . pioglitazone (ACTOS) 45 MG tablet Take 45 mg by mouth daily.    . quinapril (ACCUPRIL) 40 MG tablet Take 40 mg by mouth daily.      Allergies as of 03/04/2015  . (No Known Allergies)    Family History  Problem Relation Age of Onset  . Colon cancer Neg Hx     Social History   Social History  . Marital Status: Single    Spouse Name: N/A  . Number of Children: N/A  . Years of Education: N/A   Occupational History  . Not on file.  Social History Main Topics  . Smoking status: Never Smoker   . Smokeless tobacco: Not on file  . Alcohol Use: No  . Drug Use: No  . Sexual Activity: Not on file   Other Topics Concern  . Not on file   Social History Narrative    Review of Systems: Gen: Denies any fever, chills, sweats, anorexia, fatigue, weakness, malaise, weight loss, and sleep disorder CV: Denies chest pain, angina, palpitations, Resp: Denies dyspnea at rest, dyspnea with exercise, cough, sputum, wheezing, coughing up blood, and  pleurisy. GI: Denies vomiting blood, jaundice, and fecal incontinence.   Denies dysphagia or odynophagia. Derm: Denies rash, itching, dry skin, hives, moles, warts, or unhealing ulcers.   Physical Exam: Vital signs in last 24 hours: Temp:  [98 F (36.7 C)-98.4 F (36.9 C)] 98 F (36.7 C) (12/24 1418) Pulse Rate:  [93-122] 102 (12/24 1500) Resp:  [14-20] 14 (12/24 1418) BP: (101-150)/(67-93) 101/80 mmHg (12/24 1500) SpO2:  [98 %-100 %] 100 % (12/24 1500) Weight:  [140 lb (63.504 kg)] 140 lb (63.504 kg) (12/24 0950)   General:   Very [name],  cooperative in NAD - accompanied by multiple family members Lungs:  Clear throughout to auscultation.   No wheezes, crackles, or rhonchi. No acute distress. Heart:  Regular rate and rhythm; no murmurs, clicks, rubs,  or gallops. Abdomen:  Soft, nontender and nondistended. No masses, hepatosplenomegaly or hernias noted. Normal bowel sounds, without guarding, and without rebound.   Rectal:  Deferred until time of colonoscopy.   Intake/Output from previous day:   Intake/Output this shift:    Lab Results:  Recent Labs  03/04/15 1000  WBC 7.6  HGB 10.6*  HCT 32.5*  PLT 365   BMET  Recent Labs  03/04/15 1000  NA 137  K 4.0  CL 103  CO2 24  GLUCOSE 173*  BUN 21*  CREATININE 0.83  CALCIUM 9.0   LFT No results for input(s): PROT, ALBUMIN, AST, ALT, ALKPHOS, BILITOT, BILIDIR, IBILI in the last 72 hours. PT/INR No results for input(s): LABPROT, INR in the last 72 hours. Hepatitis Panel No results for input(s): HEPBSAG, HCVAB, HEPAIGM, HEPBIGM in the last 72 hours. C-Diff No results for input(s): CDIFFTOX in the last 72 hours.  Studies/Results: Ct Abdomen Pelvis W Contrast  03/04/2015  CLINICAL DATA:  69 year old female with hematochezia EXAM: CT ABDOMEN AND PELVIS WITH CONTRAST TECHNIQUE: Multidetector CT imaging of the abdomen and pelvis was performed using the standard protocol following bolus administration of  intravenous contrast. CONTRAST: [id number]<Environmental con579 58[facility]<[phone] con431-08[facility] T<[phone] con718-67[facility] <[phone] con([phone] Arr<[phone] con260-02[facility] S<[phone]>Environmental con6310[facility]<[phone] con347-69[facility] <[phone] con([phone]<MEASURE617 [phone] con([phone] <[phone] [phone] Environmental con224-61[facility] Mi<[phone] con228-69[facility]<[phone] con([phone]<[phone] con9724[facility]<[phone] con507-50[phone] [phone] 13[facility]<MEASUREMEN380<[phone] con562-22[facility]<[id number] con202-25[facility] SpiEleanorrvLAbraDayton Martesl CenterN COMPARISON:  Prior CT abdomen/ pelvis 01/25/2007 FINDINGS: Lower Chest: The lung bases are clear. Visualized cardiac structures are within normal limits for size. No pericardial effusion. Unremarkable visualized distal thoracic esophagus. Calcifications are present along the course of the coronary arteries. Abdomen: Unremarkable CT appearance of the stomach, duodenum, spleen, adrenal glands and pancreas. Normal hepatic contour and morphology. No discrete hepatic lesion. Gallbladder is unremarkable. No intra or extrahepatic biliary ductal dilatation. Unremarkable appearance of the bilateral kidneys. No focal solid lesion, hydronephrosis or nephrolithiasis. Small low-attenuation lesions in the left kidney measure less than 1 cm in size and are too small to characterize but are statistically highly likely benign cysts. There is been no significant change compared to November of 2008 consistent with benignity. High attenuation material is present layering dependently within the ascending colon just proximal to the splenic flexure. There are several diverticula in the region including 1 with a focus of high  attenuation. On the delayed images, the high density material poles dependently and appears larger in volume. These findings are consistent with an acute hemorrhage, likely diverticular in origin. No focal bowel wall thickening or mass. Normal appendix. No evidence of obstruction. Moderate volume of formed stool in the rectum. Pelvis: Surgical changes of prior hysterectomy. Mild pelvic floor laxity. No free fluid or suspicious adenopathy. Bones/Soft Tissues: No acute fracture or aggressive appearing lytic or blastic osseous lesion. Multilevel lower lumbar degenerative disc disease. Vascular: Atherosclerotic vascular disease without significant stenosis or aneurysmal dilatation. Focal ectasia of the infrarenal abdominal aorta to a maximal diameter  of 2.3 cm. IMPRESSION: 1. Positive for active hemorrhage in the ascending colon just proximal to the hepatic flexure. Likely diverticular in origin. 2. Atherosclerotic vascular calcifications including coronary artery calcifications. 3. Moderate volume form stool in the rectum. 4. Pelvic floor laxity. 5. Multilevel lower lumbar degenerative disc disease. 6. Additional ancillary findings as above. These results were called by telephone at the time of interpretation on 03/04/2015 at 12:18 pm to Dr. Donnetta Hutching , who verbally acknowledged these results. Electronically Signed   By: Malachy Moan M.D.   On: 03/04/2015 12:19   LOS: 0 days   Impression: Amber 69 year old Schwartz possessed with painless hematochezia. CT scanning demonstrates high attenuation fluid emanating from a focal point in the area ascending colon suspicious for blood. This is in an area of significant diverticulosis as confirmed with prior colonoscopy.  She's had no abdominal pain whatsoever which makes ischemia much less likely. I suspect she is suffering from  a diverticular bleed.   Recommendations:  Agree with admission. I've offered the patient a therapeutic colonoscopy.  The risks, benefits, limitations, alternatives and imponderables have been reviewed with the patient and multiple family members. Questions have been answered. All parties are agreeable.   We'll expedite colon preparation. We'll plan for a little later this afternoon     Eula Listen  03/04/2015, 3:30 PM    Notice:  This dictation was prepared with Dragon dictation along with smaller phrase technology. Any transcriptional errors that result from this process are unintentional and may not be corrected upon review.

## 2015-03-04 NOTE — H&P (Signed)
[facility] History and Physical  Amber GellJacqueline W Gladwin ZOX:[id number] DOB: 02-01-1946 DOA: 03/04/2015  Referring physician: Donnetta HutchingBrian Cook, MD. PCP: Abran RichardBAUCOM, JENNY B, PA-C   Chief Complaint: Blood in Stool   HPI: [name] is a 69 y.o. female with a hx of HTN, DM type 2, HLD, MI, and anemia that presents with complaints of hematochezia that began around 7am this morning. Patient reports 3 episodes thus far today of dark red blood per rectum with clots present. She is unaware of any alleviating or exacerbating factors and has not taken anything for her symptoms PTA. She admits to associated generalized weakness but denies any abdominal pain, nausea, vomiting, CP, or SOB. She denies NSAID use or history of similar. Last colonoscopy in 2006.   While in the ED, vital signs were stable. She has had another 3 bowel movements, 2 of which contained blood. Labs revealed normal renal function, a WBC of 7.6, and a Hgb of 10.6. Abdominal CT revealed an active hemorrhage in the ascending colon just proximal to the hepatic flexure, likely diverticular in origin. She will be admitted for further management.     Review of Systems:  Constitutional:  Generalized weakness No weight loss, night sweats, Fevers, chills, fatigue.  HEENT:  No headaches, Difficulty swallowing,Tooth/dental problems,Sore throat,  No sneezing, itching, ear ache, nasal congestion, post nasal drip,  Cardio-vascular:  No chest pain, Orthopnea, PND, swelling in lower extremities, anasarca, dizziness, palpitations  GI:  Rectal bleeding No heartburn, indigestion, abdominal pain, nausea, vomiting,   loss of appetite  Resp:  No shortness of breath with exertion or at rest. No excess mucus, no productive cough, No non-productive cough, No coughing up of blood.No change in color of mucus.No wheezing.No chest wall deformity  Skin:  no rash or lesions.  GU:  no dysuria, change in color of urine, no urgency or frequency. No  flank pain.  Musculoskeletal:  No joint pain or swelling. No decreased range of motion. No back pain.  Psych:  No change in mood or affect. No depression or anxiety. No memory loss.   Past Medical History  Diagnosis Date  . Hypertension   . Diabetes (HCC)   . High cholesterol   . Myocardial infarction (HCC) 2004.   Past Surgical History  Procedure Laterality Date  . Colonoscopy  02/14/09    WJX:[id number]:sigmoid colon polyp/small internal hemorrhoids, benign polyp  . Back surgery    . Abdominal hysterectomy    . Neck surgery    . Colonoscopy  02/14/2009    SLF:3-mm sessile sigmoid colon polyp/frequent ascending colon and diverticula/small internal hemorrhoids  . Esophagogastroduodenoscopy N/A 02/26/2013    Procedure: ESOPHAGOGASTRODUODENOSCOPY (EGD);  Surgeon: West BaliSandi L Fields, MD;  Location: [facility];  Service: Endoscopy;  Laterality: N/A;  9:30   Social History:  reports that she has never smoked. She does not have any smokeless tobacco history on file. She reports that she does not drink alcohol or use illicit drugs.  No Known Allergies  Family History  Problem Relation Age of Onset  . Colon cancer Neg Hx    Prior to Admission medications   Medication Sig Start Date End Date Taking? Authorizing Provider  aspirin EC 81 MG tablet Take 81 mg by mouth daily.   Yes Historical Provider, MD  atorvastatin (LIPITOR) 40 MG tablet Take 40 mg by mouth daily.   Yes Historical Provider, MD  glipiZIDE (GLUCOTROL) 10 MG tablet Take 10 mg by mouth daily before breakfast.  02/04/15  Yes Historical  Provider, MD  hydrochlorothiazide (HYDRODIURIL) 25 MG tablet Take 25 mg by mouth daily.  06/17/12  Yes Historical Provider, MD  metFORMIN (GLUCOPHAGE) 1000 MG tablet Take 1,000 mg by mouth daily with breakfast.  02/04/15  Yes Historical Provider, MD  pioglitazone (ACTOS) 45 MG tablet Take 45 mg by mouth daily. 02/17/14  Yes Historical Provider, MD  quinapril (ACCUPRIL) 40 MG tablet Take 40 mg by mouth  daily. 02/08/14  Yes Historical Provider, MD   Physical Exam: Filed Vitals:   03/04/15 1019 03/04/15 1030 03/04/15 1100 03/04/15 1321  BP: 109/84 133/93 137/67 119/81  Pulse: 96 93  101  Temp:      TempSrc:      Resp: Height:      Weight:      SpO2: 100% 98%  98%    Wt Readings from Last 3 Encounters:  03/04/15 63.504 kg (140 lb)  09/02/14 62.596 kg (138 lb)  04/28/14 60.782 kg (134 lb)    General:  Appears calm and comfortable Eyes: PERRL, normal lids, irises & conjunctiva ENT: grossly normal hearing, lips & tongue Neck: no LAD, masses or thyromegaly Cardiovascular: mildly tachycardic, no m/r/g. No LE edema. Telemetry: Sinus tachycardia, no arrhythmias  Respiratory: CTA bilaterally, no w/r/r. Normal respiratory effort. Abdomen: soft, ntnd Skin: no rash or induration seen on limited exam Musculoskeletal: grossly normal tone BUE/BLE Psychiatric: grossly normal mood and affect, speech fluent and appropriate Neurologic: grossly non-focal.          Labs on Admission:  Basic Metabolic Panel:  Recent Labs Lab 03/04/15 1000  NA 137  K 4.0  CL 103  CO2 24  GLUCOSE 173*  BUN 21*  CREATININE 0.83  CALCIUM 9.0   CBC:  Recent Labs Lab 03/04/15 1000  WBC 7.6  NEUTROABS 4.2  HGB 10.6*  HCT 32.5*  MCV 92.6  PLT 365   Radiological Exams on Admission: Ct Abdomen Pelvis W Contrast  03/04/2015  CLINICAL DATA:  69 year old female with hematochezia EXAM: CT ABDOMEN AND PELVIS WITH CONTRAST TECHNIQUE: Multidetector CT imaging of the abdomen and pelvis was performed using the standard protocol following bolus administration of intravenous contrast. CONTRAST:  OMNIPAQUE IOHEXOL 300 MG/ML  SOLN COMPARISON:  Prior CT abdomen/ pelvis 01/25/2007 FINDINGS: Lower Chest: The lung bases are clear. Visualized cardiac structures are within normal limits for size. No pericardial effusion. Unremarkable visualized distal thoracic esophagus. Calcifications are present  along the course of the coronary arteries. Abdomen: Unremarkable CT appearance of the stomach, duodenum, spleen, adrenal glands and pancreas. Normal hepatic contour and morphology. No discrete hepatic lesion. Gallbladder is unremarkable. No intra or extrahepatic biliary ductal dilatation. Unremarkable appearance of the bilateral kidneys. No focal solid lesion, hydronephrosis or nephrolithiasis. Small low-attenuation lesions in the left kidney measure less than 1 cm in size and are too small to characterize but are statistically highly likely benign cysts. There is been no significant change compared to November of 2008 consistent with benignity. High attenuation material is present layering dependently within the ascending colon just proximal to the splenic flexure. There are several diverticula in the region including 1 with a focus of high attenuation. On the delayed images, the high density material poles dependently and appears larger in volume. These findings are consistent with an acute hemorrhage, likely diverticular in origin. No focal bowel wall thickening or mass. Normal appendix. No evidence of obstruction. Moderate volume of formed stool in the rectum. Pelvis: Surgical changes of prior hysterectomy. Mild pelvic floor  laxity. No free fluid or suspicious adenopathy. Bones/Soft Tissues: No acute fracture or aggressive appearing lytic or blastic osseous lesion. Multilevel lower lumbar degenerative disc disease. Vascular: Atherosclerotic vascular disease without significant stenosis or aneurysmal dilatation. Focal ectasia of the infrarenal abdominal aorta to a maximal diameter of 2.3 cm. IMPRESSION: 1. Positive for active hemorrhage in the ascending colon just proximal to the hepatic flexure. Likely diverticular in origin. 2. Atherosclerotic vascular calcifications including coronary artery calcifications. 3. Moderate volume form stool in the rectum. 4. Pelvic floor laxity. 5. Multilevel lower lumbar  degenerative disc disease. 6. Additional ancillary findings as above. These results were called by telephone at the time of interpretation on 03/04/2015 at 12:18 pm to Dr. Donnetta Hutching , who verbally acknowledged these results. Electronically Signed   By: Malachy Moan M.D.   On: 03/04/2015 12:19    EKG: Independently reviewed. Sinus tachycardia  Assessment/Plan Active Problems:   * No active hospital problems. *   1. Rectal bleeding. Abdominal CT revealed an active hemorrhage in the ascending colon just proximal to the hepatic flexure, likely diverticular in origin.  Discussed with Dr. Jena Gauss who will likely perform colonoscopy later today. She will receive bowel prep with Golytely. Will continue to monitor Hgb. Continue IV hydration. Currently she is hemodynamically stable. She denies any excessive NSAID use and is not on anticoagulation.   Continue NPO for now.  2. ABLA, secondary to above. Hgb stable at 10.6. Will continue to monitor cbc q6h and transfuse as necessary.  3. HTN, stable. Will hold antihypertensives in the setting of bleeding. Will resume once bleeding has stabilized. 4. DM type 2, stable. Start SSI. Hold oral agents.  5. HLD, continue statin.     Code Status: Full  DVT Prophylaxis: SCDs Family Communication: Family at bedside. Discussed plan with patient and family.  Disposition Plan: Admit to medical floor.    Time spent: 55 minutes  [employer]. MD [facility] Pager [phone]   By signing my name below, I, Burnett Harry, attest that this documentation has been prepared under the direction and in the presence of [facility]. MD Electronically Signed: Burnett Harry, Scribe. 03/04/2015  I, Dr. Erick Blinks, personally performed the services described in this documentaiton. All medical record entries made by the scribe were at my direction and in my presence. I have reviewed the chart and agree that the record reflects my personal performance  and is accurate and complete  Erick Blinks, MD, 03/04/2015 2:50 PM

## 2015-03-04 NOTE — ED Notes (Signed)
Patient transported to CT 

## 2015-03-04 NOTE — ED Notes (Signed)
Pt verbalizes she began having blood in stools starting today. Pt denies any pain. Pt requested to use the restroom before getting triaged. Pt produced dark red stools.

## 2015-03-04 NOTE — Op Note (Signed)
NAME:  Marcelo BaldyICKARD, Tempie          ACCOUNT NO.:  [id number]  MEDICAL RECORD NO.:  [id number]  LOCATION:  [facility]                          FACILITY:  [facility]  PHYSICIAN:  R. Roetta SessionsMichael Kisa Fujii, MD FACP FACGDATE OF BIRTH:  [birth year]  DATE OF PROCEDURE: DATE OF DISCHARGE:                              OPERATIVE REPORT   PROCEDURE PERFORMED:  Ileocolonoscopy with bleeding control therapy and snare polypectomy.  INDICATIONS FOR PROCEDURE:  69 year old lady presented with painless rectal bleeding today.  CT scan demonstrated what looked to be blood in the lumen of the colon in the ascending segment.  She remains hemodynamically stable.  Colonoscopy is now being done with intent on bleeding control therapy.  Risks, benefits, limitations, alternatives, and imponderables have been discussed with the patient and patient's family members.  Please see documentation and medical record.  PROCEDURE NOTE:  O2 saturation, blood pressure, pulse, and respirations were monitored throughout the entire procedure.  Conscious sedation; versed 8 mg IV and Demerol 50 mg IV in divided doses.  INSTRUMENT:  Pentax video chip system.  FINDINGS:  Digital rectal exam revealed no abnormalities.  Endoscopic findings, the prep was good.  Examination of rectal mucosa including retroflexed view of the anal verge demonstrated no abnormalities.  The colon was somewhat redundant.  The patient was noted to have innocent appearing right colon diverticula with the remainder of the colon appearing entirely normal distal to the ileocecal valve.  I found a small amount of fresh blood around a single diverticulum in the lateral aspect of the cecum opposite the ileocecal valve.  This was an unstable position, it was difficult to see it well, subsequently; I noted fresh blood reaccumulating in this area; it was washed away and saw similar phenomena the third time.  I elected to go ahead and place hemostasis clips on this diverticulum.   I placed two 360 clips directly on this diverticulum.  I saw no further blood.  Aside from this localized blood at this location, there was no blood anywhere else in the colon.  I intubated the terminal ileum 5 cm and this segment of GI tract appeared normal.  It was free of blood. On the way in, I did note a 3-mm polyp at the splenic flexure, not mentioned above.  It was cold snare removed.  The patient tolerated the procedure very well.  Cecal withdrawal time 29 minutes.  Estimated blood loss 2 mL (from the polypectomy).  IMPRESSION:  Redundant colon, right-sided diverticula  -  suspect lesion in the base of the cecum (cecal diverticulum), treated with hemostasis, clips as described above.  RECOMMENDATIONS: 1. Follow clinically.  Follow H and H. 2. Advance to a clear liquid diet. 3. No MRI until clips gone. 4. Follow-up on pathology     R. Roetta SessionsMichael Jeret Goyer, MD Caleen EssexFACP FACG     RMR/MEDQ  D:  03/04/2015  T:  03/04/2015  Job:  [id number]

## 2015-03-04 NOTE — ED Provider Notes (Addendum)
History  By signing my name below, I, Karle Plumber, attest that this documentation has been prepared under the direction and in the presence of Donnetta Hutching, MD. Electronically Signed: Karle Plumber, ED Scribe. 03/04/2015. 10:46 AM.  Chief Complaint  Patient presents with  . Blood In Stools   The history is provided by the patient and medical records. No language interpreter was used.    HPI Comments:  [name] is a 69 y.o. female who presents to the Emergency Department complaining of hematochezia that began approximately 3 hours ago. Pt reports associated generalized weakness but attributes that to not eating anything yet today. She reports red stool with clots present as well. She states this is the first time she has ever had anything like this happen before. She has not done anything for treatment. She denies modifying factors. She denies abdominal pain, rectal pain, CP, nausea or vomiting. She states her PCP is Dr. Emilio Math at [facility]  Past Medical History  Diagnosis Date  . Hypertension   . Diabetes (HCC)   . High cholesterol   . Myocardial infarction (HCC) 2004.   Past Surgical History  Procedure Laterality Date  . Colonoscopy  02/14/09    ZOX:[id number] colon polyp/small internal hemorrhoids, benign polyp  . Back surgery    . Abdominal hysterectomy    . Neck surgery    . Colonoscopy  02/14/2009    SLF:3-mm sessile sigmoid colon polyp/frequent ascending colon and diverticula/small internal hemorrhoids  . Esophagogastroduodenoscopy N/A 02/26/2013    Procedure: ESOPHAGOGASTRODUODENOSCOPY (EGD);  Surgeon: West Bali, MD;  Location: [facility];  Service: Endoscopy;  Laterality: N/A;  9:30   Family History  Problem Relation Age of Onset  . Colon cancer Neg Hx    Social History  Substance Use Topics  . Smoking status: Never Smoker   . Smokeless tobacco: None  . Alcohol Use: No   OB History    No data available     Review of  Systems A complete 10 system review of systems was obtained and all systems are negative except as noted in the HPI and PMH.   Allergies  Review of patient's allergies indicates no known allergies.  Home Medications   Prior to Admission medications   Medication Sig Start Date End Date Taking? Authorizing Provider  aspirin EC 81 MG tablet Take 81 mg by mouth daily.   Yes Historical Provider, MD  atorvastatin (LIPITOR) 40 MG tablet Take 40 mg by mouth daily.   Yes Historical Provider, MD  glipiZIDE (GLUCOTROL) 10 MG tablet Take 10 mg by mouth daily before breakfast.  02/04/15  Yes Historical Provider, MD  hydrochlorothiazide (HYDRODIURIL) 25 MG tablet Take 25 mg by mouth daily.  06/17/12  Yes Historical Provider, MD  metFORMIN (GLUCOPHAGE) 1000 MG tablet Take 1,000 mg by mouth daily with breakfast.  02/04/15  Yes Historical Provider, MD  pioglitazone (ACTOS) 45 MG tablet Take 45 mg by mouth daily. 02/17/14  Yes Historical Provider, MD  quinapril (ACCUPRIL) 40 MG tablet Take 40 mg by mouth daily. 02/08/14  Yes Historical Provider, MD   Triage Vitals: BP 150/80 mmHg  Pulse 122  Temp(Src) 98.4 F (36.9 C) (Oral)  Resp 16  Ht 5' (1.524 m)  Wt 140 lb (63.504 kg)  BMI 27.34 kg/m2  SpO2 98% Physical Exam  Constitutional: She is oriented to person, place, and time. She appears well-developed and well-nourished.  HENT:  Head: Normocephalic and atraumatic.  Eyes: Conjunctivae and EOM are  normal. Pupils are equal, round, and reactive to light.  Neck: Normal range of motion. Neck supple.  Cardiovascular: Normal rate and regular rhythm.   Pulmonary/Chest: Effort normal and breath sounds normal.  Abdominal: Soft. Bowel sounds are normal. There is no tenderness.  Musculoskeletal: Normal range of motion.  Neurological: She is alert and oriented to person, place, and time.  Skin: Skin is warm and dry.  Psychiatric: She has a normal mood and affect. Her behavior is normal.  Nursing note and vitals  reviewed.   ED Course  Procedures (including critical care time) DIAGNOSTIC STUDIES: Oxygen Saturation is 98% on RA, normal by my interpretation.   COORDINATION OF CARE: 10:09 AM- Will perform rectal exam, order CT of abdomen and labs. Pt verbalizes understanding and agrees to plan.  Medications  sodium chloride 0.9 % bolus 1,000 mL (0 mLs Intravenous Stopped 03/04/15 1313)  iohexol (OMNIPAQUE) 300 MG/ML solution 100 mL (100 mLs Intravenous Contrast Given 03/04/15 1148)    Labs Review Labs Reviewed  BASIC METABOLIC PANEL - Abnormal; Notable for the following:    Glucose, Bld 173 (*)    BUN 21 (*)    All other components within normal limits  CBC WITH DIFFERENTIAL/PLATELET - Abnormal; Notable for the following:    RBC 3.51 (*)    Hemoglobin 10.6 (*)    HCT 32.5 (*)    All other components within normal limits  TYPE AND SCREEN    Imaging Review Ct Abdomen Pelvis W Contrast  03/04/2015  CLINICAL DATA:  69 year old female with hematochezia EXAM: CT ABDOMEN AND PELVIS WITH CONTRAST TECHNIQUE: Multidetector CT imaging of the abdomen and pelvis was performed using the standard protocol following bolus administration of intravenous contrast. CONTRAST:  100mL OMNIPAQUE IOHEXOL 300 MG/ML  SOLN COMPARISON:  Prior CT abdomen/ pelvis 01/25/2007 FINDINGS: Lower Chest: The lung bases are clear. Visualized cardiac structures are within normal limits for size. No pericardial effusion. Unremarkable visualized distal thoracic esophagus. Calcifications are present along the course of the coronary arteries. Abdomen: Unremarkable CT appearance of the stomach, duodenum, spleen, adrenal glands and pancreas. Normal hepatic contour and morphology. No discrete hepatic lesion. Gallbladder is unremarkable. No intra or extrahepatic biliary ductal dilatation. Unremarkable appearance of the bilateral kidneys. No focal solid lesion, hydronephrosis or nephrolithiasis. Small low-attenuation lesions in the left kidney  measure less than 1 cm in size and are too small to characterize but are statistically highly likely benign cysts. There is been no significant change compared to November of 2008 consistent with benignity. High attenuation material is present layering dependently within the ascending colon just proximal to the splenic flexure. There are several diverticula in the region including 1 with a focus of high attenuation. On the delayed images, the high density material poles dependently and appears larger in volume. These findings are consistent with an acute hemorrhage, likely diverticular in origin. No focal bowel wall thickening or mass. Normal appendix. No evidence of obstruction. Moderate volume of formed stool in the rectum. Pelvis: Surgical changes of prior hysterectomy. Mild pelvic floor laxity. No free fluid or suspicious adenopathy. Bones/Soft Tissues: No acute fracture or aggressive appearing lytic or blastic osseous lesion. Multilevel lower lumbar degenerative disc disease. Vascular: Atherosclerotic vascular disease without significant stenosis or aneurysmal dilatation. Focal ectasia of the infrarenal abdominal aorta to a maximal diameter of 2.3 cm. IMPRESSION: 1. Positive for active hemorrhage in the ascending colon just proximal to the hepatic flexure. Likely diverticular in origin. 2. Atherosclerotic vascular calcifications including coronary artery calcifications. 3.  Moderate volume form stool in the rectum. 4. Pelvic floor laxity. 5. Multilevel lower lumbar degenerative disc disease. 6. Additional ancillary findings as above. These results were called by telephone at the time of interpretation on 03/04/2015 at 12:18 pm to Dr. Donnetta Hutching , who verbally acknowledged these results. Electronically Signed   By: Malachy Moan M.D.   On: 03/04/2015 12:19   I have personally reviewed and evaluated these images and lab results as part of my medical decision-making.   EKG Interpretation   Date/Time:   Saturday March 04 2015 10:08:39 EST Ventricular Rate:  107 PR Interval:  155 QRS Duration: 100 QT Interval:  330 QTC Calculation: 440 R Axis:   45 Text Interpretation:  Sinus tachycardia Confirmed by Talley Casco  MD, Lavarr President  [phone]) on 03/04/2015 10:42:46 AM     CRITICAL CARE Performed by: Donnetta Hutching Total critical care time: 30 minutes Critical care time was exclusive of separately billable procedures and treating other patients. Critical care was necessary to treat or prevent imminent or life-threatening deterioration. Critical care was time spent personally by me on the following activities: development of treatment plan with patient and/or surrogate as well as nursing, discussions with consultants, evaluation of patient's response to treatment, examination of patient, obtaining history from patient or surrogate, ordering and performing treatments and interventions, ordering and review of laboratory studies, ordering and review of radiographic studies, pulse oximetry and re-evaluation of patient's condition. MDM   Final diagnoses:  Lower GI bleed    Patient is hemodynamically stable. Rectal exam grossly heme positive with blood on the glove. CT scan reveals active hemorrhage in the ascending colon just proximal to the hepatic flexure.  Discussed with hospitalist. Pending call from gastroenterologist on call at [facility].  Radiologist stated that interventional radiologist was available if needed      I personally performed the services described in this documentation, which was scribed in my presence. The recorded information has been reviewed and is accurate.      Donnetta Hutching, MD 03/04/15 1404  Donnetta Hutching, MD 03/04/15 [phone]

## 2015-03-04 NOTE — ED Notes (Signed)
Pt has had 3 episodes of bloody (dark red and clots) stool since arrival to ED

## 2015-03-04 NOTE — ED Notes (Signed)
Report given to RN on 3A 

## 2015-03-04 NOTE — Progress Notes (Signed)
Pt hemoglobin is 6.8. I have text paged T.Claiborne BillingsCallahan of these results. There are no new orders at this time. Will continue to monitor.

## 2015-03-05 LAB — CBC
HCT: 26.3 % — ABNORMAL LOW (ref 36.0–46.0)
HCT: 27.6 % — ABNORMAL LOW (ref 36.0–46.0)
HCT: 26.4 % — ABNORMAL LOW (ref 36.0–46.0)
Hemoglobin: 8.8 g/dL — ABNORMAL LOW (ref 12.0–15.0)
Hemoglobin: 9.2 g/dL — ABNORMAL LOW (ref 12.0–15.0)
Hemoglobin: 8.9 g/dL — ABNORMAL LOW (ref 12.0–15.0)
MCH: 30.2 pg (ref 26.0–34.0)
MCH: 30.2 pg (ref 26.0–34.0)
MCH: 30.5 pg (ref 26.0–34.0)
MCHC: 33.5 g/dL (ref 30.0–36.0)
MCHC: 33.3 g/dL (ref 30.0–36.0)
MCHC: 33.7 g/dL (ref 30.0–36.0)
MCV: 90.4 fL (ref 78.0–100.0)
MCV: 90.5 fL (ref 78.0–100.0)
MCV: 90.4 fL (ref 78.0–100.0)
Platelets: 246 10*3/uL (ref 150–400)
Platelets: 259 10*3/uL (ref 150–400)
Platelets: 241 10*3/uL (ref 150–400)
RBC: 2.91 MIL/uL — ABNORMAL LOW (ref 3.87–5.11)
RBC: 3.05 MIL/uL — ABNORMAL LOW (ref 3.87–5.11)
RBC: 2.92 MIL/uL — ABNORMAL LOW (ref 3.87–5.11)
RDW: 14.6 % (ref 11.5–15.5)
RDW: 15.3 % (ref 11.5–15.5)
RDW: 15.6 % — ABNORMAL HIGH (ref 11.5–15.5)
WBC: 6.6 10*3/uL (ref 4.0–10.5)
WBC: 6.4 10*3/uL (ref 4.0–10.5)
WBC: 8.3 10*3/uL (ref 4.0–10.5)

## 2015-03-05 LAB — GLUCOSE, CAPILLARY
Glucose-Capillary: 106 mg/dL — ABNORMAL HIGH (ref 65–99)
Glucose-Capillary: 109 mg/dL — ABNORMAL HIGH (ref 65–99)
Glucose-Capillary: 116 mg/dL — ABNORMAL HIGH (ref 65–99)
Glucose-Capillary: 128 mg/dL — ABNORMAL HIGH (ref 65–99)
Glucose-Capillary: 66 mg/dL (ref 65–99)

## 2015-03-05 LAB — PROTIME-INR
INR: 1.2 (ref 0.00–1.49)
Prothrombin Time: 15.3 seconds — ABNORMAL HIGH (ref 11.6–15.2)

## 2015-03-05 LAB — BASIC METABOLIC PANEL
Anion gap: 4 — ABNORMAL LOW (ref 5–15)
BUN: 11 mg/dL (ref 6–20)
CO2: 24 mmol/L (ref 22–32)
Calcium: 7.7 mg/dL — ABNORMAL LOW (ref 8.9–10.3)
Chloride: 112 mmol/L — ABNORMAL HIGH (ref 101–111)
Creatinine, Ser: 0.58 mg/dL (ref 0.44–1.00)
GFR calc Af Amer: >60 mL/min (ref >60)
GFR calc non Af Amer: >60 mL/min (ref >60)
Glucose, Bld: 129 mg/dL — ABNORMAL HIGH (ref 65–99)
Potassium: 3.7 mmol/L (ref 3.5–5.1)
Sodium: 140 mmol/L (ref 135–145)

## 2015-03-05 LAB — ABO/RH: ABO/RH(D): A POS

## 2015-03-05 MED ORDER — ASPIRIN EC 81 MG PO TBEC: 81.0000 mg | DELAYED_RELEASE_TABLET | Freq: Every day | ORAL | Status: AC

## 2015-03-05 NOTE — Progress Notes (Signed)
Discharged Pt per MD order and protocol. Reviewed discharge teaching and handouts given. Medication were explained. Pt verbalized understanding and left with all belongings. VSS. IV catheters D/C.  Patient wheeled down by staff member. Lesly Dukesachel J Everett, RN

## 2015-03-05 NOTE — Discharge Summary (Signed)
Physician Discharge Summary  [name] ZOX:[id number] DOB: 1945-10-20 DOA: 03/04/2015  PCP: Abran RichardBAUCOM, JENNY B, PA-C  Admit date: 03/04/2015 Discharge date: 03/05/2015  Time spent: 35 minutes  Recommendations for Outpatient Follow-up:  1. Follow up with PCP within 1-2 weeks.  2. Follow up with GI for pathology results.  3. Hydrochlorothiazide and ACE-I on hold due to borderline blood pressure. Will resume as outpatient as blood pressure tolerates.    Discharge Diagnoses:  Principal Problem:   GI bleeding Active Problems:   HTN (hypertension)   Diabetes mellitus (HCC)   HLD (hyperlipidemia)   Acute blood loss anemia   Lower GI bleed   GI bleed   History of colonic polyps   Diverticulosis of colon with hemorrhage   Discharge Condition: Improved   Diet recommendation: Heart healthy   Filed Weights   03/04/15 0950  Weight: 63.504 kg (140 lb)    History of present illness:  69 y.o. female with a hx of HTN, DM type 2, HLD, MI, and anemia that presents with complaints of hematochezia that began around 7am this morning. Patient reports 3 episodes thus far today of dark red blood per rectum with clots present. She is unaware of any alleviating or exacerbating factors and has not taken anything for her symptoms PTA. She admits to associated generalized weakness but denies any abdominal pain, nausea, vomiting, CP, or SOB. She denies NSAID use or history of similar. Last colonoscopy in 2006.  While in the ED, vital signs were stable. She has had another 3 bowel movements, 2 of which contained blood. Labs revealed normal renal function, a WBC of 7.6, and a Hgb of 10.6. Abdominal CT revealed an active hemorrhage in the ascending colon just proximal to the hepatic flexure, likely diverticular in origin. She will be admitted for further management.   Hospital Course:  Patient presented with hematochezia which was revealed on CT scan to be active hemorrhage in the ascending colon. In  the ER she had 2-3 bloody bowel movements. Colonoscopy was performed by GI who noted right-sided diverticula with suspect lesion in the base of the cecum and treated with clips. Since her procedure she has not had any further blood per rectum. Upon discharge hemodynamically stable. Per GI she should not start ASA for another 7 days. Diet was advanced, she is tolerating a solid diet.  1. ABLA, secondary to above. Hgb noted to trend down to 6.8 overnight, but continues to improve s/p 1U PRBC, 9.2 12/25. Follow up hemoglobins have been stable and she does not have evidence of ongoing bleeding. 2. HTN, stable. Antihypertensives held in the setting of bleeding. Will resume as outpatient as blood pressure tolerates.  3. DM type 2, stable. Oral agents were held. SSI  4. HLD, continue statin.  Procedures:  Colonoscopy 12/24  Transfused 1 unit PRBC 12/25  Consultations:  GI  Discharge Exam: Filed Vitals:   03/05/15 0410 03/05/15 1252  BP: 121/48 125/54  Pulse: 80 95  Temp: 99.1 F (37.3 C) 98.2 F (36.8 C)  Resp: 16 16    5. General: NAD, looks comfortable 6. Cardiovascular: RRR, S1, S2  7. Respiratory: clear bilaterally, No wheezing, rales or rhonchi 8. Abdomen: soft, non tender, no distention , bowel sounds normal 9. Musculoskeletal: No edema b/l  Discharge Instructions   Discharge Instructions    Diet - low sodium heart healthy    Complete by:  As directed      Increase activity slowly    Complete by:  As directed           Current Discharge Medication List    CONTINUE these medications which have CHANGED   Details  aspirin EC 81 MG tablet Take 1 tablet (81 mg total) by mouth daily. Restart on 03/12/15      CONTINUE these medications which have NOT CHANGED   Details  atorvastatin (LIPITOR) 40 MG tablet Take 40 mg by mouth daily.    glipiZIDE (GLUCOTROL) 10 MG tablet Take 10 mg by mouth daily before breakfast.     metFORMIN (GLUCOPHAGE) 1000 MG tablet Take 1,000 mg  by mouth daily with breakfast.     pioglitazone (ACTOS) 45 MG tablet Take 45 mg by mouth daily.      STOP taking these medications     hydrochlorothiazide (HYDRODIURIL) 25 MG tablet      quinapril (ACCUPRIL) 40 MG tablet        No Known Allergies    The results of significant diagnostics from this hospitalization (including imaging, microbiology, ancillary and laboratory) are listed below for reference.    Significant Diagnostic Studies: Ct Abdomen Pelvis W Contrast  03/04/2015  CLINICAL DATA:  69 year old female with hematochezia EXAM: CT ABDOMEN AND PELVIS WITH CONTRAST TECHNIQUE: Multidetector CT imaging of the abdomen and pelvis was performed using the standard protocol following bolus administration of intravenous contrast. CONTRAST:  OMNIPAQUE IOHEXOL 300 MG/ML  SOLN COMPARISON:  Prior CT abdomen/ pelvis 01/25/2007 FINDINGS: Lower Chest: The lung bases are clear. Visualized cardiac structures are within normal limits for size. No pericardial effusion. Unremarkable visualized distal thoracic esophagus. Calcifications are present along the course of the coronary arteries. Abdomen: Unremarkable CT appearance of the stomach, duodenum, spleen, adrenal glands and pancreas. Normal hepatic contour and morphology. No discrete hepatic lesion. Gallbladder is unremarkable. No intra or extrahepatic biliary ductal dilatation. Unremarkable appearance of the bilateral kidneys. No focal solid lesion, hydronephrosis or nephrolithiasis. Small low-attenuation lesions in the left kidney measure less than 1 cm in size and are too small to characterize but are statistically highly likely benign cysts. There is been no significant change compared to November of 2008 consistent with benignity. High attenuation material is present layering dependently within the ascending colon just proximal to the splenic flexure. There are several diverticula in the region including 1 with a focus of high attenuation. On  the delayed images, the high density material poles dependently and appears larger in volume. These findings are consistent with an acute hemorrhage, likely diverticular in origin. No focal bowel wall thickening or mass. Normal appendix. No evidence of obstruction. Moderate volume of formed stool in the rectum. Pelvis: Surgical changes of prior hysterectomy. Mild pelvic floor laxity. No free fluid or suspicious adenopathy. Bones/Soft Tissues: No acute fracture or aggressive appearing lytic or blastic osseous lesion. Multilevel lower lumbar degenerative disc disease. Vascular: Atherosclerotic vascular disease without significant stenosis or aneurysmal dilatation. Focal ectasia of the infrarenal abdominal aorta to a maximal diameter of 2.3 cm. IMPRESSION: 1. Positive for active hemorrhage in the ascending colon just proximal to the hepatic flexure. Likely diverticular in origin. 2. Atherosclerotic vascular calcifications including coronary artery calcifications. 3. Moderate volume form stool in the rectum. 4. Pelvic floor laxity. 5. Multilevel lower lumbar degenerative disc disease. 6. Additional ancillary findings as above. These results were called by telephone at the time of interpretation on 03/04/2015 at 12:18 pm to Dr. Donnetta Hutching , who verbally acknowledged these results. Electronically Signed   By: Malachy Moan M.D.   On: 03/04/2015  12:19    Microbiology: No results found for this or any previous visit (from the past 240 hour(s)).   Labs: Basic Metabolic Panel:  Recent Labs Lab 03/04/15 1000 03/05/15 0441  NA 137 140  K 4.0 3.7  CL 103 112*  CO2 24 24  GLUCOSE 173* 129*  BUN 21* 11  CREATININE 0.83 0.58  CALCIUM 9.0 7.7*   CBC:  Recent Labs Lab 03/04/15 1000 03/04/15 1629 03/04/15 2242 03/05/15 0441 03/05/15 0934 03/05/15 1618  WBC 7.6 9.0 7.6 6.6 6.4 8.3  NEUTROABS 4.2  --   --   --   --   --   HGB 10.6* 8.3* 6.7* 8.8* 9.2* 8.9*  HCT 32.5* 25.2* 20.4* 26.3* 27.6* 26.4*   MCV 92.6 93.0 92.7 90.4 90.5 90.4  PLT 365 296 230 246 259 241    CBG:  Recent Labs Lab 03/05/15 0028 03/05/15 0516 03/05/15 0712 03/05/15 1132 03/05/15 1603  GLUCAP 109* 106* 116* 128* 66      Signed:  Erick Blinks, MD  [facility] 03/05/2015, 4:28 PM    By signing my name below, I, Zadie Cleverly, attest that this documentation has been prepared under the direction and in the presence of Erick Blinks, MD. Electronically signed: Zadie Cleverly, Scribe.03/05/2015 10:49am   I, Dr. Erick Blinks, personally performed the services described in this documentaiton. All medical record entries made by the scribe were at my direction and in my presence. I have reviewed the chart and agree that the record reflects my personal performance and is accurate and complete  Erick Blinks, MD, 03/05/2015 4:28 PM

## 2015-03-05 NOTE — Plan of Care (Signed)
Problem: Bowel/Gastric: Goal: Will not experience complications related to bowel motility Outcome: Completed/Met Date Met:  03/05/15 Pt has had no bloody stools today.

## 2015-03-05 NOTE — Progress Notes (Addendum)
Patient feels well today. No abdominal pain. She has not had a bloody stool since colonoscopy yesterday Hemoglobin nadired to 6.7-received 1 unit of packed red blood cells;  back up to 9.2 this morning. She is hungry.  Discussed with Dr. Kerry HoughMemon   Vital signs in last 24 hours: Temp:  [97.7 F (36.5 C)-99.1 F (37.3 C)] 99.1 F (37.3 C) (12/25 0410) Pulse Rate:  [58-102] 80 (12/25 0410) Resp:  [11-25] 16 (12/25 0410) BP: (80-139)/(41-81) 121/48 mmHg (12/25 0410) SpO2:  [96 %-100 %] 100 % (12/25 0410) Last BM Date: 03/04/15 General:   Alert,  Well-developed, well-nourished, pleasant and cooperative in NAD Abdomen: Nondistended.  Normal bowel sounds, without guarding, and without rebound.  No mass or organomegaly. Extremities:  Without clubbing or edema.    Intake/Output from previous day: 12/24 0701 - 12/25 0700 In: 2633 [P.O.:480; I.V.:1818; Blood:335] Out: 500 [Urine:500] Intake/Output this shift: Total I/O In: 240 [P.O.:240] Out: -   Lab Results:  Recent Labs  03/04/15 2242 03/05/15 0441 03/05/15 0934  WBC 7.6 6.6 6.4  HGB 6.7* 8.8* 9.2*  HCT 20.4* 26.3* 27.6*  PLT 230 246 259   BMET  Recent Labs  03/04/15 1000 03/05/15 0441  NA 137 140  K 4.0 3.7  CL 103 112*  CO2 24 24  GLUCOSE 173* 129*  BUN 21* 11  CREATININE 0.83 0.58  CALCIUM 9.0 7.7*   LFT No results for input(s): PROT, ALBUMIN, AST, ALT, ALKPHOS, BILITOT, BILIDIR, IBILI in the last 72 hours. PT/INR  Recent Labs  03/05/15 0441  LABPROT 15.3*  INR 1.20   Hepatitis Panel No results for input(s): HEPBSAG, HCVAB, HEPAIGM, HEPBIGM in the last 72 hours. C-Diff No results for input(s): CDIFFTOX in the last 72 hours.  Studies/Results: Ct Abdomen Pelvis W Contrast  03/04/2015  CLINICAL DATA:  69 year old female with hematochezia EXAM: CT ABDOMEN AND PELVIS WITH CONTRAST TECHNIQUE: Multidetector CT imaging of the abdomen and pelvis was performed using the standard protocol following bolus  administration of intravenous contrast. CONTRAST:  100mL OMNIPAQUE IOHEXOL 300 MG/ML  SOLN COMPARISON:  Prior CT abdomen/ pelvis 01/25/2007 FINDINGS: Lower Chest: The lung bases are clear. Visualized cardiac structures are within normal limits for size. No pericardial effusion. Unremarkable visualized distal thoracic esophagus. Calcifications are present along the course of the coronary arteries. Abdomen: Unremarkable CT appearance of the stomach, duodenum, spleen, adrenal glands and pancreas. Normal hepatic contour and morphology. No discrete hepatic lesion. Gallbladder is unremarkable. No intra or extrahepatic biliary ductal dilatation. Unremarkable appearance of the bilateral kidneys. No focal solid lesion, hydronephrosis or nephrolithiasis. Small low-attenuation lesions in the left kidney measure less than 1 cm in size and are too small to characterize but are statistically highly likely benign cysts. There is been no significant change compared to November of 2008 consistent with benignity. High attenuation material is present layering dependently within the ascending colon just proximal to the splenic flexure. There are several diverticula in the region including 1 with a focus of high attenuation. On the delayed images, the high density material poles dependently and appears larger in volume. These findings are consistent with an acute hemorrhage, likely diverticular in origin. No focal bowel wall thickening or mass. Normal appendix. No evidence of obstruction. Moderate volume of formed stool in the rectum. Pelvis: Surgical changes of prior hysterectomy. Mild pelvic floor laxity. No free fluid or suspicious adenopathy. Bones/Soft Tissues: No acute fracture or aggressive appearing lytic or blastic osseous lesion. Multilevel lower lumbar degenerative disc disease. Vascular: Atherosclerotic  vascular disease without significant stenosis or aneurysmal dilatation. Focal ectasia of the infrarenal abdominal aorta to  a maximal diameter of 2.3 cm. IMPRESSION: 1. Positive for active hemorrhage in the ascending colon just proximal to the hepatic flexure. Likely diverticular in origin. 2. Atherosclerotic vascular calcifications including coronary artery calcifications. 3. Moderate volume form stool in the rectum. 4. Pelvic floor laxity. 5. Multilevel lower lumbar degenerative disc disease. 6. Additional ancillary findings as above. These results were called by telephone at the time of interpretation on 03/04/2015 at 12:18 pm to Dr. Donnetta Hutching , who verbally acknowledged these results. Electronically Signed   By: Malachy Moan M.D.   On: 03/04/2015 12:19   Impression:   Cecal diverticular bleed-resolved with hemostasis clips. Small polyp removed Bleeding seems to have ceased  Recommendations:  Advance diet. If she remains stable without further evidence of bleeding, she could probably go home late today or tomorrow morning per hospitalist service. No aspirin 7 days. I'll Follow-up on polyp pathology.  No MRI in the future until clips gone

## 2015-03-05 NOTE — Plan of Care (Signed)
Problem: Nutrition: Goal: Adequate nutrition will be maintained Outcome: Completed/Met Date Met:  03/05/15 Pt's diet advanced to heart healthy. Pt is tolerating diet well.

## 2015-03-06 LAB — TYPE AND SCREEN
ABO/RH(D): A POS
Antibody Screen: NEGATIVE
Unit division: 0

## 2015-03-09 ENCOUNTER — Encounter (HOSPITAL_COMMUNITY): Payer: Self-pay | Admitting: Internal Medicine

## 2015-03-12 ENCOUNTER — Encounter: Payer: Self-pay | Admitting: Internal Medicine

## 2015-08-01 ENCOUNTER — Encounter (HOSPITAL_COMMUNITY): Payer: Self-pay | Admitting: Emergency Medicine

## 2015-08-01 ENCOUNTER — Emergency Department (HOSPITAL_COMMUNITY)
Admission: EM | Admit: 2015-08-01 | Discharge: 2015-08-01 | Disposition: A | Discharge status: Home / Self Care | Payer: Medicare HMO | Attending: Emergency Medicine | Admitting: Emergency Medicine

## 2015-08-01 ENCOUNTER — Emergency Department (HOSPITAL_COMMUNITY): Payer: Medicare HMO

## 2015-08-01 DIAGNOSIS — E119 Type 2 diabetes mellitus without complications: Secondary | ICD-10-CM | POA: Diagnosis not present

## 2015-08-01 DIAGNOSIS — Z79899 Other long term (current) drug therapy: Secondary | ICD-10-CM | POA: Insufficient documentation

## 2015-08-01 DIAGNOSIS — I252 Old myocardial infarction: Secondary | ICD-10-CM | POA: Insufficient documentation

## 2015-08-01 DIAGNOSIS — Z7984 Long term (current) use of oral hypoglycemic drugs: Secondary | ICD-10-CM | POA: Diagnosis not present

## 2015-08-01 DIAGNOSIS — Z7982 Long term (current) use of aspirin: Secondary | ICD-10-CM | POA: Insufficient documentation

## 2015-08-01 DIAGNOSIS — I10 Essential (primary) hypertension: Secondary | ICD-10-CM | POA: Insufficient documentation

## 2015-08-01 DIAGNOSIS — R079 Chest pain, unspecified: Secondary | ICD-10-CM | POA: Insufficient documentation

## 2015-08-01 LAB — BASIC METABOLIC PANEL
Anion gap: 9 (ref 5–15)
BUN: 13 mg/dL (ref 6–20)
CO2: 22 mmol/L (ref 22–32)
Calcium: 8.9 mg/dL (ref 8.9–10.3)
Chloride: 101 mmol/L (ref 101–111)
Creatinine, Ser: 0.74 mg/dL (ref 0.44–1.00)
GFR calc Af Amer: >60 mL/min (ref >60)
GFR calc non Af Amer: >60 mL/min (ref >60)
Glucose, Bld: 160 mg/dL — ABNORMAL HIGH (ref 65–99)
Potassium: 3.8 mmol/L (ref 3.5–5.1)
Sodium: 132 mmol/L — ABNORMAL LOW (ref 135–145)

## 2015-08-01 LAB — CBC
HCT: 33.8 % — ABNORMAL LOW (ref 36.0–46.0)
Hemoglobin: 10.7 g/dL — ABNORMAL LOW (ref 12.0–15.0)
MCH: 27.3 pg (ref 26.0–34.0)
MCHC: 31.7 g/dL (ref 30.0–36.0)
MCV: 86.2 fL (ref 78.0–100.0)
Platelets: 410 10*3/uL — ABNORMAL HIGH (ref 150–400)
RBC: 3.92 MIL/uL (ref 3.87–5.11)
RDW: 15.6 % — ABNORMAL HIGH (ref 11.5–15.5)
WBC: 9.4 10*3/uL (ref 4.0–10.5)

## 2015-08-01 LAB — I-STAT TROPONIN, ED: Troponin i, poc: 0.01 ng/mL (ref 0.00–0.08)

## 2015-08-01 LAB — TROPONIN I: Troponin I: <0.03 ng/mL (ref <0.031)

## 2015-08-01 MED ORDER — TRAMADOL HCL 50 MG PO TABS: 50.0000 mg | ORAL_TABLET | Freq: Four times a day (QID) | ORAL | Status: DC | PRN

## 2015-08-01 MED ORDER — ACETAMINOPHEN 500 MG PO TABS
1000.0000 mg | ORAL_TABLET | Freq: Once | ORAL | Status: AC
  Administered 2015-08-01: 1000 mg via ORAL
  Filled 2015-08-01: qty 2

## 2015-08-01 NOTE — Discharge Instructions (Signed)
Follow-up with cardiology in [place] and follow-up with your family doctor next week

## 2015-08-01 NOTE — ED Provider Notes (Signed)
CSN: [id number]     Arrival date & time 08/01/15  1344 History   First MD Initiated Contact with Patient 08/01/15 1558     Chief Complaint  Patient presents with  . Chest Pain     (Consider location/radiation/quality/duration/timing/severity/associated sxs/prior Treatment) Patient is a 70 y.o. female presenting with chest pain. The history is provided by the patient (The patient states that she's been having chest pain since December off and on. The pain usually comes on at night.).  Chest Pain Pain location:  L chest Pain quality: aching   Pain radiates to:  Does not radiate Pain radiates to the back: no   Pain severity:  Mild Onset quality:  Sudden Timing:  Sporadic Associated symptoms: no abdominal pain, no back pain, no cough, no fatigue and no headache     Past Medical History  Diagnosis Date  . Hypertension   . Diabetes (HCC)   . High cholesterol   . Myocardial infarction (HCC) 2004.   Past Surgical History  Procedure Laterality Date  . Colonoscopy  02/14/09    [id number] colon polyp/small internal hemorrhoids, benign polyp  . Back surgery    . Abdominal hysterectomy    . Neck surgery    . Colonoscopy  02/14/2009    SLF:3-mm sessile sigmoid colon polyp/frequent ascending colon and diverticula/small internal hemorrhoids  . Esophagogastroduodenoscopy N/A 02/26/2013    Procedure: ESOPHAGOGASTRODUODENOSCOPY (EGD);  Surgeon: West Bali, MD;  Location: [facility];  Service: Endoscopy;  Laterality: N/A;  9:30  . Colonoscopy N/A 03/04/2015    Procedure: COLONOSCOPY;  Surgeon: Corbin Ade, MD;  Location: [facility];  Service: Endoscopy;  Laterality: N/A;   Family History  Problem Relation Age of Onset  . Colon cancer Neg Hx    Social History  Substance Use Topics  . Smoking status: Never Smoker   . Smokeless tobacco: None  . Alcohol Use: No   OB History    No data available     Review of Systems  Constitutional: Negative for appetite change and  fatigue.  HENT: Negative for congestion, ear discharge and sinus pressure.   Eyes: Negative for discharge.  Respiratory: Negative for cough.   Cardiovascular: Positive for chest pain.  Gastrointestinal: Negative for abdominal pain and diarrhea.  Genitourinary: Negative for frequency and hematuria.  Musculoskeletal: Negative for back pain.  Skin: Negative for rash.  Neurological: Negative for seizures and headaches.  Psychiatric/Behavioral: Negative for hallucinations.      Allergies  Review of patient's allergies indicates no known allergies.  Home Medications   Prior to Admission medications   Medication Sig Start Date End Date Taking? Authorizing Provider  aspirin EC 81 MG tablet Take 1 tablet (81 mg total) by mouth daily. Restart on 03/12/15 03/05/15  Yes Erick Blinks, MD  glipiZIDE (GLUCOTROL) 10 MG tablet Take 10 mg by mouth daily before breakfast.  02/04/15  Yes Historical Provider, MD  hydrochlorothiazide (HYDRODIURIL) 25 MG tablet Take 25 mg by mouth daily. 07/05/15  Yes Historical Provider, MD  metFORMIN (GLUCOPHAGE) 1000 MG tablet Take 1,000 mg by mouth daily with breakfast.  02/04/15  Yes Historical Provider, MD  pioglitazone (ACTOS) 45 MG tablet Take 22.5 mg by mouth daily.  02/17/14  Yes Historical Provider, MD  Potassium Gluconate 595 MG CAPS Take 1 capsule by mouth daily.   Yes Historical Provider, MD  quinapril (ACCUPRIL) 40 MG tablet Take 40 mg by mouth daily. 07/05/15  Yes Historical Provider, MD  traMADol (ULTRAM) 50 MG tablet  Take 1 tablet (50 mg total) by mouth every 6 (six) hours as needed. 08/01/15   Bethann BerkshireJoseph Avory Rahimi, MD   BP 121/76 mmHg  Pulse 89  Temp(Src) 99.1 F (37.3 C) (Oral)  Resp 27  Ht 5\' 1"  (1.549 m)  Wt 138 lb (62.596 kg)  BMI 26.09 kg/m2  SpO2 98% Physical Exam  Constitutional: She is oriented to person, place, and time. She appears well-developed.  HENT:  Head: Normocephalic.  Eyes: Conjunctivae and EOM are normal. No scleral icterus.  Neck:  Neck supple. No thyromegaly present.  Cardiovascular: Normal rate and regular rhythm.  Exam reveals no gallop and no friction rub.   No murmur heard. Pulmonary/Chest: No stridor. She has no wheezes. She has no rales. She exhibits no tenderness.  Abdominal: She exhibits no distension. There is no tenderness. There is no rebound.  Musculoskeletal: Normal range of motion. She exhibits no edema.  Lymphadenopathy:    She has no cervical adenopathy.  Neurological: She is oriented to person, place, and time. She exhibits normal muscle tone. Coordination normal.  Skin: No rash noted. No erythema.  Psychiatric: She has a normal mood and affect. Her behavior is normal.    ED Course  Procedures (including critical care time) Labs Review Labs Reviewed  BASIC METABOLIC PANEL - Abnormal; Notable for the following:    Sodium 132 (*)    Glucose, Bld 160 (*)    All other components within normal limits  CBC - Abnormal; Notable for the following:    Hemoglobin 10.7 (*)    HCT 33.8 (*)    RDW 15.6 (*)    Platelets 410 (*)    All other components within normal limits  TROPONIN I  I-STAT TROPOININ, ED    Imaging Review Dg Chest 2 View  08/01/2015  CLINICAL DATA:  Chest pain EXAM: CHEST  2 VIEW COMPARISON:  04/28/2014 FINDINGS: The heart size and mediastinal contours are within normal limits. Both lungs are clear. The visualized skeletal structures are unremarkable. IMPRESSION: No active cardiopulmonary disease. Electronically Signed   By: Alcide CleverMark  Lukens M.D.   On: 08/01/2015 14:40   I have personally reviewed and evaluated these images and lab results as part of my medical decision-making.   EKG Interpretation   Date/Time:  Tuesday Aug 01 2015 14:03:07 EDT Ventricular Rate:  115 PR Interval:  158 QRS Duration: 90 QT Interval:  328 QTC Calculation: 453 R Axis:   53 Text Interpretation:  Sinus tachycardia Otherwise normal ECG Confirmed by  Correll Denbow  MD, Marrell Dicaprio ([id number]) on 08/01/2015 4:10:40 PM       MDM   Final diagnoses:  Chest pain at rest    Diagnosis atypical chest pain. Patient had 2 troponins are normal, doubt coronary artery disease. She will follow-up with her PCP in a cardiologist. Patient given some Ultram for discomfort   Bethann BerkshireJoseph Damira Kem, MD 08/01/15 [phone]

## 2015-08-01 NOTE — ED Notes (Signed)
Patient complaining of left sided chest "aching" off and on since December. Denies any other symptoms. States pain is worse at night and this time it has been constant since Sunday.

## 2015-08-22 NOTE — Progress Notes (Signed)
Patient ID: [name], female   DOB: November 06, 1945, 70 y.o.   MRN: [id number]     Cardiology Office Note   Date:  08/22/2015   ID:  [name], DOB November 06, 1945, MRN [id number]  PCP:  Abran RichardBAUCOM, JENNY B, PA-C  Cardiologist:   Charlton HawsPeter Ronella Plunk, MD   No chief complaint on file.     History of Present Illness: [name] is a 70 y.o. female who presents for evaluation of atypical chest pain Seen in ER for same on 08/01/15 R/O no ECG changes Rx with Ultram  CXR with NAD  Labs reviewed and ok except anemia Hb 10.8 BS up and Na 132.  Diabetic with HTN and elevated lipids She indicates pain in chest for years 1994 had cath with no intervention. Nothing makes pain come on or get better Can wake with it. No radiation Located centrally Not pleuritic or positional. Tramadol has helped some. Does have some achyness and joint pains with arthritis Compliant with meds Has not seen heart doctor since 2394     Past Medical History  Diagnosis Date  . Hypertension   . Diabetes (HCC)   . High cholesterol   . Myocardial infarction (HCC) 2004.    Past Surgical History  Procedure Laterality Date  . Colonoscopy  02/14/09    NWG:NFAOZHYSLF:sigmoid colon polyp/small internal hemorrhoids, benign polyp  . Back surgery    . Abdominal hysterectomy    . Neck surgery    . Colonoscopy  02/14/2009    SLF:3-mm sessile sigmoid colon polyp/frequent ascending colon and diverticula/small internal hemorrhoids  . Esophagogastroduodenoscopy N/A 02/26/2013    Procedure: ESOPHAGOGASTRODUODENOSCOPY (EGD);  Surgeon: West BaliSandi L Fields, MD;  [facility];  Service: Endoscopy;  Laterality: N/A;  9:30  . Colonoscopy N/A 03/04/2015    Procedure: COLONOSCOPY;  Surgeon: Corbin Adeobert M Rourk, MD;  [facility];  Service: Endoscopy;  Laterality: N/A;     Current Outpatient Prescriptions  Medication Sig Dispense Refill  . aspirin EC 81 MG tablet Take 1 tablet (81 mg total) by mouth daily. Restart on 03/12/15    .  glipiZIDE (GLUCOTROL) 10 MG tablet Take 10 mg by mouth daily before breakfast.     . hydrochlorothiazide (HYDRODIURIL) 25 MG tablet Take 25 mg by mouth daily.    . metFORMIN (GLUCOPHAGE) 1000 MG tablet Take 1,000 mg by mouth daily with breakfast.     . pioglitazone (ACTOS) 45 MG tablet Take 22.5 mg by mouth daily.     . Potassium Gluconate 595 MG CAPS Take 1 capsule by mouth daily.    . quinapril (ACCUPRIL) 40 MG tablet Take 40 mg by mouth daily.    . traMADol (ULTRAM) 50 MG tablet Take 1 tablet (50 mg total) by mouth every 6 (six) hours as needed. 20 tablet 0   No current facility-administered medications for this visit.    Allergies:   Review of patient's allergies indicates no known allergies.    Social History:  The patient  reports that she has never smoked. She does not have any smokeless tobacco history on file. She reports that she does not drink alcohol or use illicit drugs.   Family History:  The patient's family history is negative for Colon cancer.    ROS:  Please see the history of present illness.   Otherwise, review of systems are positive for none.   All other systems are reviewed and negative.    PHYSICAL EXAM: VS:  There were no vitals taken  for this visit. , BMI There is no weight on file to calculate BMI. Affect appropriate Healthy:  appears stated age HEENT: normal Neck supple with no adenopathy JVP normal right bruits no thyromegaly Lungs clear with no wheezing and good diaphragmatic motion Heart:  S1/S2 no murmur, no rub, gallop or click PMI normal Abdomen: benighn, BS positve, no tenderness, no AAA no bruit.  No HSM or HJR Distal pulses intact with no bruits No edema Neuro non-focal Skin warm and dry No muscular weakness    EKG: 08/02/15 ST rate 115 otherwise normal    Recent Labs: 08/01/2015: BUN 13; Creatinine, Ser 0.74; Hemoglobin 10.7*; Platelets 410*; Potassium 3.8; Sodium 132*    Lipid Panel    Component Value Date/Time   CHOL   06/01/2007 0426    149        ATP III CLASSIFICATION:  <200     mg/dL   Desirable  098-119  mg/dL   Borderline High  >=147    mg/dL   High   TRIG 829 56/21/3086 0426   HDL 42 06/01/2007 0426   CHOLHDL 3.5 06/01/2007 0426   VLDL 24 06/01/2007 0426   LDLCALC  06/01/2007 0426    83        Total Cholesterol/HDL:CHD Risk Coronary Heart Disease Risk Table                     Men   Women  1/2 Average Risk   3.4   3.3      Wt Readings from Last 3 Encounters:  08/01/15 62.596 kg (138 lb)  03/04/15 63.504 kg (140 lb)  09/02/14 62.596 kg (138 lb)      Other studies Reviewed: Additional studies/ records that were reviewed today include: Primary care notes ER visit labs CXR and ECG .    ASSESSMENT AND PLAN:  1. Chest Pain:  Atypical but multiple risk factors and carotid bruit f/u exercise myovue 2. Right Bruit no TIA symptoms f/u carotid duplex 3. DM:  Discussed low carb diet.  Target hemoglobin A1c is 6.5 or less.  Continue current medications. 4. HTN:  Well controlled.  Continue current medications and low sodium Dash type diet.   5. Chol: not on statin but LDL under 100 diet Rx unless found to have PVD/CAD   Current medicines are reviewed at length with the patient today.  The patient does not have concerns regarding medicines.  The following changes have been made:  no change  Labs/ tests ordered today include: Carotid Duplex and exercise myovue   No orders of the defined types were placed in this encounter.     Disposition:   FU with me PRN      Signed, Charlton Haws, MD  08/22/2015 11:25 PM    [facility] [address] [place], [place], [place]  [address] Phone: [phone]; Fax: [phone]

## 2015-08-23 ENCOUNTER — Encounter: Payer: Self-pay | Admitting: *Deleted

## 2015-08-23 ENCOUNTER — Encounter: Payer: Self-pay | Admitting: Cardiovascular Disease

## 2015-08-23 ENCOUNTER — Ambulatory Visit (INDEPENDENT_AMBULATORY_CARE_PROVIDER_SITE_OTHER): Payer: Medicare HMO | Admitting: Cardiovascular Disease

## 2015-08-23 VITALS — BP 130/68 | HR 99 | Ht 61.0 in | Wt 137.0 lb

## 2015-08-23 DIAGNOSIS — R0602 Shortness of breath: Secondary | ICD-10-CM

## 2015-08-23 DIAGNOSIS — R079 Chest pain, unspecified: Secondary | ICD-10-CM | POA: Diagnosis not present

## 2015-08-23 DIAGNOSIS — R0989 Other specified symptoms and signs involving the circulatory and respiratory systems: Secondary | ICD-10-CM

## 2015-08-23 NOTE — Patient Instructions (Signed)
Your physician recommends that you schedule a follow-up appointment in: As Needed with Dr. Eden EmmsNishan  Your physician recommends that you continue on your current medications as directed. Please refer to the Current Medication list given to you today.  Your physician has requested that you have an echocardiogram. Echocardiography is a painless test that uses sound waves to create images of your heart. It provides your doctor with information about the size and shape of your heart and how well your heart's chambers and valves are working. This procedure takes approximately one hour. There are no restrictions for this procedure.  Your physician has requested that you have en exercise stress myoview. For further information please visit [email or web address]. Please follow instruction sheet, as given.  Your physician has requested that you have a carotid duplex. This test is an ultrasound of the carotid arteries in your neck. It looks at blood flow through these arteries that supply the brain with blood. Allow one hour for this exam. There are no restrictions or special instructions.    Thank you for choosing [facility]!

## 2015-08-31 ENCOUNTER — Encounter (HOSPITAL_COMMUNITY)
Admission: RE | Admit: 2015-08-31 | Discharge: 2015-08-31 | Disposition: A | Discharge status: Home / Self Care | Payer: Medicare HMO | Source: Ambulatory Visit | Attending: Cardiovascular Disease | Admitting: Cardiovascular Disease

## 2015-08-31 ENCOUNTER — Ambulatory Visit (HOSPITAL_COMMUNITY)
Admission: RE | Admit: 2015-08-31 | Discharge: 2015-08-31 | Disposition: A | Discharge status: Home / Self Care | Payer: Medicare HMO | Source: Ambulatory Visit | Attending: Cardiovascular Disease | Admitting: Cardiovascular Disease

## 2015-08-31 ENCOUNTER — Inpatient Hospital Stay (HOSPITAL_COMMUNITY): Admission: RE | Admit: 2015-08-31 | Payer: Medicare HMO | Source: Ambulatory Visit

## 2015-08-31 ENCOUNTER — Encounter (HOSPITAL_COMMUNITY): Payer: Self-pay

## 2015-08-31 DIAGNOSIS — R079 Chest pain, unspecified: Secondary | ICD-10-CM | POA: Diagnosis not present

## 2015-08-31 DIAGNOSIS — I6523 Occlusion and stenosis of bilateral carotid arteries: Secondary | ICD-10-CM | POA: Insufficient documentation

## 2015-08-31 DIAGNOSIS — I510 Cardiac septal defect, acquired: Secondary | ICD-10-CM | POA: Diagnosis not present

## 2015-08-31 DIAGNOSIS — R0989 Other specified symptoms and signs involving the circulatory and respiratory systems: Secondary | ICD-10-CM | POA: Insufficient documentation

## 2015-08-31 DIAGNOSIS — R931 Abnormal findings on diagnostic imaging of heart and coronary circulation: Secondary | ICD-10-CM | POA: Insufficient documentation

## 2015-08-31 DIAGNOSIS — R0602 Shortness of breath: Secondary | ICD-10-CM | POA: Diagnosis not present

## 2015-08-31 LAB — NM MYOCAR MULTI W/SPECT W/WALL MOTION / EF
Estimated workload: 4.6 METS
Exercise duration (min): 1 min
Exercise duration (sec): 33 s
LV dias vol: 54 mL (ref 46–106)
LV sys vol: 30 mL
MPHR: 152 {beats}/min
Peak HR: 127 {beats}/min
Percent HR: 83 %
RATE: 0.22
RPE: 14
Rest HR: 90 {beats}/min
SDS: 10
SRS: 6
SSS: 16
TID: 0.95

## 2015-08-31 MED ORDER — TECHNETIUM TC 99M TETROFOSMIN IV KIT
30.0000 | PACK | Freq: Once | INTRAVENOUS | Status: AC | PRN
  Administered 2015-08-31: 29 via INTRAVENOUS

## 2015-08-31 MED ORDER — SODIUM CHLORIDE 0.9% FLUSH
INTRAVENOUS | Status: AC
  Administered 2015-08-31: 10 mL via INTRAVENOUS
  Filled 2015-08-31: qty 10

## 2015-08-31 MED ORDER — TECHNETIUM TC 99M TETROFOSMIN IV KIT
10.0000 | PACK | Freq: Once | INTRAVENOUS | Status: AC | PRN
  Administered 2015-08-31: 10.5 via INTRAVENOUS

## 2015-08-31 MED ORDER — REGADENOSON 0.4 MG/5ML IV SOLN
INTRAVENOUS | Status: AC
Start: 2015-08-31 — End: 2015-08-31
  Administered 2015-08-31: 0.4 mg via INTRAVENOUS
  Filled 2015-08-31: qty 5

## 2015-09-04 ENCOUNTER — Encounter: Payer: Self-pay | Admitting: Adult Health

## 2015-09-04 ENCOUNTER — Ambulatory Visit (INDEPENDENT_AMBULATORY_CARE_PROVIDER_SITE_OTHER): Payer: Medicare HMO | Admitting: Adult Health

## 2015-09-04 ENCOUNTER — Ambulatory Visit (HOSPITAL_COMMUNITY)
Admission: RE | Admit: 2015-09-04 | Discharge: 2015-09-04 | Disposition: A | Discharge status: Home / Self Care | Payer: Medicare HMO | Source: Ambulatory Visit | Attending: Cardiovascular Disease | Admitting: Cardiovascular Disease

## 2015-09-04 VITALS — BP 132/68 | HR 93 | Ht 60.0 in | Wt 137.0 lb

## 2015-09-04 DIAGNOSIS — E785 Hyperlipidemia, unspecified: Secondary | ICD-10-CM | POA: Insufficient documentation

## 2015-09-04 DIAGNOSIS — R079 Chest pain, unspecified: Secondary | ICD-10-CM | POA: Diagnosis present

## 2015-09-04 DIAGNOSIS — I2510 Atherosclerotic heart disease of native coronary artery without angina pectoris: Secondary | ICD-10-CM

## 2015-09-04 DIAGNOSIS — E119 Type 2 diabetes mellitus without complications: Secondary | ICD-10-CM | POA: Insufficient documentation

## 2015-09-04 DIAGNOSIS — R0602 Shortness of breath: Secondary | ICD-10-CM | POA: Insufficient documentation

## 2015-09-04 DIAGNOSIS — J0100 Acute maxillary sinusitis, unspecified: Secondary | ICD-10-CM

## 2015-09-04 DIAGNOSIS — R0789 Other chest pain: Secondary | ICD-10-CM

## 2015-09-04 DIAGNOSIS — I119 Hypertensive heart disease without heart failure: Secondary | ICD-10-CM | POA: Diagnosis not present

## 2015-09-04 LAB — ECHOCARDIOGRAM COMPLETE
E decel time: 203 msec
E/e' ratio: 12.09
FS: 29 % (ref 28–44)
IVS/LV PW RATIO, ED: 1.17
LA ID, A-P, ES: 29 mm
LA diam end sys: 29 mm
LA diam index: 1.76 cm/m2
LA vol A4C: 29.3 ml
LA vol index: 18.4 mL/m2
LA vol: 30.3 mL
LV E/e' medial: 12.09
LV E/e'average: 12.09
LV PW d: 10.2 mm — AB (ref 0.6–1.1)
LV dias vol index: 24 mL/m2
LV dias vol: 39 mL — AB (ref 46–106)
LV e' LATERAL: 7.51 cm/s
LV sys vol index: 10 mL/m2
LV sys vol: 17 mL (ref 14–42)
LVOT area: 2.27 cm2
LVOT diameter: 17 mm
Lateral S' vel: 14.6 cm/s
MV Dec: 203
MV Peak grad: 3 mmHg
MV pk A vel: 118 m/s
MV pk E vel: 90.8 m/s
Simpson's disk: 56
Stroke v: 22 ml
TAPSE: 22.2 mm
TDI e' lateral: 7.51
TDI e' medial: 7.83

## 2015-09-04 MED ORDER — LEVOFLOXACIN 250 MG PO TABS: 250.0000 mg | ORAL_TABLET | Freq: Every day | ORAL | Status: DC

## 2015-09-04 MED ORDER — NITROGLYCERIN 0.4 MG SL SUBL: 0.4000 mg | SUBLINGUAL_TABLET | SUBLINGUAL | Status: DC | PRN

## 2015-09-04 MED ORDER — GUAIFENESIN ER 1200 MG PO TB12: 1200.0000 mg | ORAL_TABLET | Freq: Two times a day (BID) | ORAL | Status: DC

## 2015-09-04 NOTE — Progress Notes (Signed)
Cardiology Office Note   Date:  09/04/2015   ID:  Kenn FileJacqueline W Schwartz, DOB November 02, 1945, MRN [id number]  PCP:  [name], Amber B, PA-C  Cardiologist: [name]/  Amber Schauer, NP   No chief complaint on file.     History of Present Illness: Amber GellJacqueline W Schwartz is a 70 y.o. female who presents for ongoing assessment and management of chest pain, with history of hypertension, diabetes, hypercholesterolemia. Was seen by Dr. Eden EmmsNishan as a new patient on 08/22/2015. Due to symptoms, a stress test was ordered. She was also found to have a carotid bruits and therefore a carotid ultrasound was ordered.   Stress test was completed on 08/31/2015  Defect 1: There is a medium defect of moderate severity present in the basal anteroseptal, basal inferoseptal, basal inferior, mid anteroseptal, mid inferoseptal and mid inferior location. There is a mild degree of mid-anteroseptal ischemia. Soft tissue attenuation artifact also playing a role.  Nuclear stress EF: 44%.  Findings consistent with prior myocardial infarction with peri-infarct ischemia.  This is an intermediate risk study.   Carotid Ultrasound IMPRESSION: 1. Mild bilateral common carotid and carotid bifurcation atherosclerotic vascular disease. No flow limiting stenosis. Degree of stenosis less than 50%.  2. Vertebral arteries are patent with antegrade flow.  Cardiac Cath 2004 ASSESSMENT: Our assessment is this is a woman with severe single-vessel coronary disease likely secondary to an old myocardial infarction. This vessel appears not to be amenable to angioplasty. There was no evidence of significant ischemia on perfusion study, so I am going to treat this woman medically with aggressive risk factor modification and anti-anginal therapy. I discussed her case with her primary care physician, Dr. Laveda Schwartz, who agrees with my assessment.  She comes today without further chest discomfort. She complains now of cough and  congestion with chest soreness associated with this. She has not been using any NTG.   Past Medical History  Diagnosis Date  . Hypertension   . Diabetes (HCC)   . High cholesterol   . Myocardial infarction (HCC) 2004.    Past Surgical History  Procedure Laterality Date  . Colonoscopy  02/14/09    XBJ:YNWGNFASLF:sigmoid colon polyp/small internal hemorrhoids, benign polyp  . Back surgery    . Abdominal hysterectomy    . Neck surgery    . Colonoscopy  02/14/2009    SLF:3-mm sessile sigmoid colon polyp/frequent ascending colon and diverticula/small internal hemorrhoids  . Esophagogastroduodenoscopy N/A 02/26/2013    Procedure: ESOPHAGOGASTRODUODENOSCOPY (EGD);  Surgeon: West BaliSandi L Fields, MD;  [facility];  Service: Endoscopy;  Laterality: N/A;  9:30  . Colonoscopy N/A 03/04/2015    Procedure: COLONOSCOPY;  Surgeon: Corbin Adeobert M Rourk, MD;  [facility];  Service: Endoscopy;  Laterality: N/A;     Current Outpatient Prescriptions  Medication Sig Dispense Refill  . aspirin EC 81 MG tablet Take 1 tablet (81 mg total) by mouth daily. Restart on 03/12/15    . glipiZIDE (GLUCOTROL) 10 MG tablet Take 10 mg by mouth daily before breakfast.     . hydrochlorothiazide (HYDRODIURIL) 25 MG tablet Take 25 mg by mouth daily.    . metFORMIN (GLUCOPHAGE) 1000 MG tablet Take 1,000 mg by mouth daily with breakfast.     . pioglitazone (ACTOS) 45 MG tablet Take 22.5 mg by mouth daily.     . Potassium Gluconate 595 MG CAPS Take 1 capsule by mouth daily.    . quinapril (ACCUPRIL) 40 MG tablet Take 40 mg by mouth daily.    .Marland Kitchen  traMADol (ULTRAM) 50 MG tablet Take 1 tablet (50 mg total) by mouth every 6 (six) hours as needed. 20 tablet 0  . Guaifenesin 1200 MG TB12 Take 1 tablet (1,200 mg total) by mouth 2 (two) times daily. 14 each 0  . levofloxacin (LEVAQUIN) 250 MG tablet Take 1 tablet (250 mg total) by mouth daily. 7 tablet 0  . nitroGLYCERIN (NITROSTAT) 0.4 MG SL tablet Place 1 tablet (0.4 mg total)  under the tongue every 5 (five) minutes as needed for chest pain. 25 tablet 3   No current facility-administered medications for this visit.    Allergies:   Review of patient's allergies indicates no known allergies.    Social History:  The patient  reports that she quit smoking about 10 years ago. She does not have any smokeless tobacco history on file. She reports that she does not drink alcohol or use illicit drugs.   Family History:  The patient's family history is negative for Colon cancer.    ROS: All other systems are reviewed and negative. Unless otherwise mentioned in H&P    PHYSICAL EXAM: VS:  BP 132/68 mmHg  Pulse 93  Ht 5' (1.524 m)  Wt 137 lb (62.143 kg)  BMI 26.76 kg/m2  SpO2 96% , BMI Body mass index is 26.76 kg/(m^2). GEN: Well nourished, well developed, in no acute distress HEENT: normal Neck: no JVD, carotid bruits, or masses Cardiac: RRR; no murmurs, rubs, or gallops,no edema  Respiratory:  Clear to auscultation bilaterally, normal work of breathing Frequent coughing in upper airways.  GI: soft, nontender, nondistended, + BS MS: no deformity or atrophy Skin: warm and dry, no rash Neuro:  Strength and sensation are intact Psych: euthymic mood, full affect  Recent Labs: 08/01/2015: BUN 13; Creatinine, Ser 0.74; Hemoglobin 10.7*; Platelets 410*; Potassium 3.8; Sodium 132*    Lipid Panel    Component Value Date/Time   CHOL  06/01/2007 0426    149        ATP III CLASSIFICATION:  <200     mg/dL   Desirable  161-096200-239  mg/dL   Borderline High  >=045>=240    mg/dL   High   TRIG 409118 81/19/147803/23/2009 0426   HDL 42 06/01/2007 0426   CHOLHDL 3.5 06/01/2007 0426   VLDL 24 06/01/2007 0426   LDLCALC  06/01/2007 0426    83        Total Cholesterol/HDL:CHD Risk Coronary Heart Disease Risk Table                     Men   Women  1/2 Average Risk   3.4   3.3      Wt Readings from Last 3 Encounters:  09/04/15 137 lb (62.143 kg)  08/23/15 137 lb (62.143 kg)  08/01/15 138  lb (62.596 kg)     ASSESSMENT AND PLAN:  1. CAD: Hx of MI in 1994, with repeat cardiac cath completed in 2004. This revealed single vessel disease of the RCA, and was not amendable to angioplasty. Stress tess did not reveal new area's of ischemia. Only peri-infarct ischemia. Will continue with medical treatment, provide new Rx for NTG. If symptoms persist, worsen or increase in frequency, will plan cardiac cath for evaluation of progressive CAD.   2. Diabetes: Followed by PCP.   3, Sinusitis with coughing and congestion: Will start her on Levoquin 250 mg daily and Muccinex. . She has just finished a course of Z-Pack but has had worsened symptoms. She is  to follow up with PCP.Will not continue to treat her primary care illness.    Current medicines are reviewed at length with the patient today.    Labs/ tests ordered today include:  No orders of the defined types were placed in this encounter.     Disposition:   FU with 6 months.   Signed, Joni Reining, NP  09/04/2015 2:57 PM    [facility] [address], [place], [place] [address] Phone: [phone]; Fax: [phone]

## 2015-09-04 NOTE — Patient Instructions (Addendum)
Your physician wants you to follow-up in: 6 Months with Dr. Eden EmmsNishan. You will receive a reminder letter in the mail two months in advance. If you don't receive a letter, please call our office to schedule the follow-up appointment.  Your physician has recommended you make the following change in your medication:   Start Levaquin 250 mg Daily for 7 Days Start Guaifenesin 1200mg  two times daily   If you need a refill on your cardiac medications before your next appointment, please call your pharmacy.  Thank you for choosing [facility]!

## 2015-09-04 NOTE — Progress Notes (Signed)
Name: [name]    DOB: 24-Apr-1945  Age: 70 y.o.  MR#: [id number]       PCP:  Abran RichardBAUCOM, JENNY B, PA-C      Insurance: Payor: Monia Pouch[employer]ETNA MEDICARE / Plan: [employer] MEDICARE HMO/PPO / Product Type: *No Product type* /   CC:   No chief complaint on file.   VS Filed Vitals:   09/04/15 1417  Height: 5' (1.524 m)  Weight: 137 lb (62.143 kg)    Weights Current Weight  09/04/15 137 lb (62.143 kg)  08/23/15 137 lb (62.143 kg)  08/01/15 138 lb (62.596 kg)    Blood Pressure  BP Readings from Last 3 Encounters:  08/23/15 130/68  08/01/15 105/44  03/05/15 125/54     Admit date:  (Not on file) Last encounter with RMR:  Visit date not found   Allergy Review of patient's allergies indicates no known allergies.  Current Outpatient Prescriptions  Medication Sig Dispense Refill  . aspirin EC 81 MG tablet Take 1 tablet (81 mg total) by mouth daily. Restart on 03/12/15    . glipiZIDE (GLUCOTROL) 10 MG tablet Take 10 mg by mouth daily before breakfast.     . hydrochlorothiazide (HYDRODIURIL) 25 MG tablet Take 25 mg by mouth daily.    . metFORMIN (GLUCOPHAGE) 1000 MG tablet Take 1,000 mg by mouth daily with breakfast.     . pioglitazone (ACTOS) 45 MG tablet Take 22.5 mg by mouth daily.     . Potassium Gluconate 595 MG CAPS Take 1 capsule by mouth daily.    . quinapril (ACCUPRIL) 40 MG tablet Take 40 mg by mouth daily.    . traMADol (ULTRAM) 50 MG tablet Take 1 tablet (50 mg total) by mouth every 6 (six) hours as needed. 20 tablet 0   No current facility-administered medications for this visit.    Discontinued Meds:   There are no discontinued medications.  Patient Active Problem List   Diagnosis Date Noted  . HTN (hypertension) 03/04/2015  . Diabetes mellitus (HCC) 03/04/2015  . HLD (hyperlipidemia) 03/04/2015  . Acute blood loss anemia 03/04/2015  . GI bleeding 03/04/2015  . Lower GI bleed 03/04/2015  . GI bleed 03/04/2015  . History of colonic polyps   . Diverticulosis of colon with  hemorrhage   . Nausea alone 07/22/2012  . Loss of weight 07/22/2012  . Anemia 07/22/2012    LABS    Component Value Date/Time   NA 132* 08/01/2015 1454   NA 140 03/05/2015 0441   NA 137 03/04/2015 1000   K 3.8 08/01/2015 1454   K 3.7 03/05/2015 0441   K 4.0 03/04/2015 1000   CL 101 08/01/2015 1454   CL 112* 03/05/2015 0441   CL 103 03/04/2015 1000   CO2 22 08/01/2015 1454   CO2 24 03/05/2015 0441   CO2 24 03/04/2015 1000   GLUCOSE 160* 08/01/2015 1454   GLUCOSE 129* 03/05/2015 0441   GLUCOSE 173* 03/04/2015 1000   BUN 13 08/01/2015 1454   BUN 11 03/05/2015 0441   BUN 21* 03/04/2015 1000   CREATININE 0.74 08/01/2015 1454   CREATININE 0.58 03/05/2015 0441   CREATININE 0.83 03/04/2015 1000   CALCIUM 8.9 08/01/2015 1454   CALCIUM 7.7* 03/05/2015 0441   CALCIUM 9.0 03/04/2015 1000   GFRNONAA >60 08/01/2015 1454   GFRNONAA >60 03/05/2015 0441   GFRNONAA >60 03/04/2015 1000   GFRAA >60 08/01/2015 1454   GFRAA >60 03/05/2015 0441   GFRAA >60 03/04/2015 1000   CMP  Component Value Date/Time   NA 132* 08/01/2015 1454   K 3.8 08/01/2015 1454   CL 101 08/01/2015 1454   CO2 22 08/01/2015 1454   GLUCOSE 160* 08/01/2015 1454   BUN 13 08/01/2015 1454   CREATININE 0.74 08/01/2015 1454   CALCIUM 8.9 08/01/2015 1454   GFRNONAA >60 08/01/2015 1454   GFRAA >60 08/01/2015 1454       Component Value Date/Time   WBC 9.4 08/01/2015 1454   WBC 8.3 03/05/2015 1618   WBC 6.4 03/05/2015 0934   HGB 10.7* 08/01/2015 1454   HGB 8.9* 03/05/2015 1618   HGB 9.2* 03/05/2015 0934   HCT 33.8* 08/01/2015 1454   HCT 26.4* 03/05/2015 1618   HCT 27.6* 03/05/2015 0934   MCV 86.2 08/01/2015 1454   MCV 90.4 03/05/2015 1618   MCV 90.5 03/05/2015 0934    Lipid Panel     Component Value Date/Time   CHOL  06/01/2007 0426    149        ATP III CLASSIFICATION:  <200     mg/dL   Desirable  454-098  mg/dL   Borderline High  >=119    mg/dL   High   TRIG 147 82/95/6213 0426   HDL 42  06/01/2007 0426   CHOLHDL 3.5 06/01/2007 0426   VLDL 24 06/01/2007 0426   LDLCALC  06/01/2007 0426    83        Total Cholesterol/HDL:CHD Risk Coronary Heart Disease Risk Table                     Men   Women  1/2 Average Risk   3.4   3.3    ABG No results found for: PHART, PCO2ART, PO2ART, HCO3, TCO2, ACIDBASEDEF, O2SAT   Lab Results  Component Value Date   TSH 0.962 02/09/2013   BNP (last 3 results) No results for input(s): BNP in the last 8760 hours.  ProBNP (last 3 results) No results for input(s): PROBNP in the last 8760 hours.  Cardiac Panel (last 3 results) No results for input(s): CKTOTAL, CKMB, TROPONINI, RELINDX in the last 72 hours.  Iron/TIBC/Ferritin/ %Sat    Component Value Date/Time   IRON 43 02/09/2013 1457   FERRITIN 45 02/09/2013 1457     EKG Orders placed or performed during the hospital encounter of 08/01/15  . EKG 12-Lead  . EKG 12-Lead  . ED EKG within 10 minutes  . ED EKG within 10 minutes  . EKG     Prior Assessment and Plan Problem List as of 09/04/2015      Cardiovascular and Mediastinum   HTN (hypertension)     Digestive   GI bleeding   Lower GI bleed   GI bleed   Diverticulosis of colon with hemorrhage     Endocrine   Diabetes mellitus (HCC)     Other   Nausea alone   Last Assessment & Plan 07/21/2012 Office Visit Written 07/22/2012  9:47 PM by Nira Retort, NP    70 year old female with vague reports of nausea for 5 months, intermittent, no vomiting or abdominal pain. Associated weight loss of around 10 lbs in the past few months. She attributes her symptoms to ill-fitting dentures, stating she "sucks" on them, which causes nausea. Possible anemia, as this is reason for referral. However, I am unable to retrieve any CBC or blood work to verify this. Last colonoscopy in 2010, with overall poor prep and recommendations for screening again in 2015.   Check  CBC, iron, ferritin EGD with Dr. Darrick PennaFields in near future (with likely TCS). The  risks, benefits, and alternatives were discussed in detail, and patient stated understanding.      Loss of weight   Last Assessment & Plan 02/09/2013 Office Visit Written 02/11/2013 10:31 PM by Nira RetortAnna W Sams, NP    70 year old female with early satiety and weight loss, concern for gastritis, PUD, unable to exclude occult malignancy. Some question of anemia per patient (see HPI). Need to update CBC, iron, ferritin, and obtain TSH. Recommend endoscopy in near future with Dr. Darrick PennaFields. May ultimately need repeat colonoscopy if evidence of IDA. Labs now with further recommendations to follow.      Anemia   Last Assessment & Plan 07/21/2012 Office Visit Written 07/22/2012  9:49 PM by Nira RetortAnna W Sams, NP    Per referral request. No labs to review at this time. Since office visit, we have requested a CBC multiple times. Unable to retrieve. Will go ahead and recheck CBC, iron, ferritin. Anticipate TCS/EGD in near future.       HLD (hyperlipidemia)   Acute blood loss anemia   History of colonic polyps       Imaging: [place] Carotid Duplex Bilateral  08/31/2015  CLINICAL DATA:  Right carotid bruit. EXAM: BILATERAL CAROTID DUPLEX ULTRASOUND TECHNIQUE: Wallace CullensGray scale imaging, color Doppler and duplex ultrasound were performed of bilateral carotid and vertebral arteries in the neck. COMPARISON:  No recent prior . FINDINGS: Criteria: Quantification of carotid stenosis is based on velocity parameters that correlate the residual internal carotid diameter with NASCET-based stenosis levels, using the diameter of the distal internal carotid lumen as the denominator for stenosis measurement. The following velocity measurements were obtained: RIGHT ICA:  95/37 cm/sec CCA:  91/19 cm/sec SYSTOLIC ICA/CCA RATIO:  1.0 DIASTOLIC ICA/CCA RATIO:  2.0 ECA:  88 cm/sec LEFT ICA:  92/27 cm/sec CCA:  91/16 cm/sec SYSTOLIC ICA/CCA RATIO:  1.0 DIASTOLIC ICA/CCA RATIO:  1.7 ECA:  70 full cm/sec RIGHT CAROTID ARTERY: Mild right common carotid and  carotid bifurcation noted. Focal plaque noted the right mid, carotid artery . No flow limiting stenosis. RIGHT VERTEBRAL ARTERY:  Patent with antegrade flow. LEFT CAROTID ARTERY: Mild left common carotid and carotid bifurcation is noted. No flow limiting stenosis . LEFT VERTEBRAL ARTERY:  Patent with antegrade flow. IMPRESSION: 1. Mild bilateral common carotid and carotid bifurcation atherosclerotic vascular disease. No flow limiting stenosis. Degree of stenosis less than 50%. 2. Vertebral arteries are patent with antegrade flow. Electronically Signed   By: Maisie Fushomas  Register   On: 08/31/2015 10:50   Nm Myocar Multi W/spect W/wall Motion / Ef  08/31/2015   Defect 1: There is a medium defect of moderate severity present in the basal anteroseptal, basal inferoseptal, basal inferior, mid anteroseptal, mid inferoseptal and mid inferior location. There is a mild degree of mid-anteroseptal ischemia. Soft tissue attenuation artifact also playing a role.  Nuclear stress EF: 44%.  Findings consistent with prior myocardial infarction with peri-infarct ischemia.  This is an intermediate risk study.

## 2015-09-05 ENCOUNTER — Telehealth: Payer: Self-pay | Admitting: *Deleted

## 2015-09-05 NOTE — Telephone Encounter (Signed)
Pt notified of echo results by phone with verbal understanding 

## 2016-02-13 ENCOUNTER — Other Ambulatory Visit (HOSPITAL_COMMUNITY): Payer: Self-pay | Admitting: Internal Medicine

## 2016-02-13 DIAGNOSIS — Z1231 Encounter for screening mammogram for malignant neoplasm of breast: Secondary | ICD-10-CM

## 2016-03-01 ENCOUNTER — Ambulatory Visit (HOSPITAL_COMMUNITY)
Admission: RE | Admit: 2016-03-01 | Discharge: 2016-03-01 | Disposition: A | Discharge status: Home / Self Care | Payer: Medicare HMO | Source: Ambulatory Visit | Attending: Internal Medicine | Admitting: Internal Medicine

## 2016-03-01 DIAGNOSIS — Z1231 Encounter for screening mammogram for malignant neoplasm of breast: Secondary | ICD-10-CM | POA: Diagnosis present

## 2016-07-21 ENCOUNTER — Emergency Department (HOSPITAL_COMMUNITY): Payer: Medicare HMO

## 2016-07-21 ENCOUNTER — Encounter (HOSPITAL_COMMUNITY): Payer: Self-pay | Admitting: Emergency Medicine

## 2016-07-21 ENCOUNTER — Observation Stay (HOSPITAL_COMMUNITY)
Admission: EM | Admit: 2016-07-21 | Discharge: 2016-07-22 | Disposition: A | Discharge status: Home / Self Care | Payer: Medicare HMO | Attending: Family Medicine | Admitting: Family Medicine

## 2016-07-21 DIAGNOSIS — Z7982 Long term (current) use of aspirin: Secondary | ICD-10-CM | POA: Insufficient documentation

## 2016-07-21 DIAGNOSIS — T887XXA Unspecified adverse effect of drug or medicament, initial encounter: Secondary | ICD-10-CM | POA: Diagnosis not present

## 2016-07-21 DIAGNOSIS — E162 Hypoglycemia, unspecified: Secondary | ICD-10-CM | POA: Diagnosis present

## 2016-07-21 DIAGNOSIS — F17290 Nicotine dependence, other tobacco product, uncomplicated: Secondary | ICD-10-CM | POA: Diagnosis not present

## 2016-07-21 DIAGNOSIS — E785 Hyperlipidemia, unspecified: Secondary | ICD-10-CM | POA: Diagnosis present

## 2016-07-21 DIAGNOSIS — I2510 Atherosclerotic heart disease of native coronary artery without angina pectoris: Secondary | ICD-10-CM | POA: Diagnosis present

## 2016-07-21 DIAGNOSIS — I10 Essential (primary) hypertension: Secondary | ICD-10-CM | POA: Diagnosis present

## 2016-07-21 DIAGNOSIS — R8290 Unspecified abnormal findings in urine: Secondary | ICD-10-CM | POA: Diagnosis present

## 2016-07-21 DIAGNOSIS — I5189 Other ill-defined heart diseases: Secondary | ICD-10-CM | POA: Diagnosis present

## 2016-07-21 DIAGNOSIS — Y829 Unspecified medical devices associated with adverse incidents: Secondary | ICD-10-CM | POA: Diagnosis not present

## 2016-07-21 DIAGNOSIS — E11649 Type 2 diabetes mellitus with hypoglycemia without coma: Secondary | ICD-10-CM | POA: Diagnosis present

## 2016-07-21 DIAGNOSIS — Z7984 Long term (current) use of oral hypoglycemic drugs: Secondary | ICD-10-CM | POA: Diagnosis not present

## 2016-07-21 DIAGNOSIS — T370X5A Adverse effect of sulfonamides, initial encounter: Secondary | ICD-10-CM | POA: Insufficient documentation

## 2016-07-21 LAB — CBG MONITORING, ED
Glucose-Capillary: 55 mg/dL — ABNORMAL LOW (ref 65–99)
Glucose-Capillary: 167 mg/dL — ABNORMAL HIGH (ref 65–99)

## 2016-07-21 LAB — URINALYSIS, ROUTINE W REFLEX MICROSCOPIC
Bacteria, UA: NONE SEEN
Bilirubin Urine: NEGATIVE
Glucose, UA: >=500 mg/dL — AB
Hgb urine dipstick: NEGATIVE
Ketones, ur: NEGATIVE mg/dL
Nitrite: NEGATIVE
Protein, ur: NEGATIVE mg/dL
Specific Gravity, Urine: 1.026 (ref 1.005–1.030)
pH: 5 (ref 5.0–8.0)

## 2016-07-21 LAB — CBC WITH DIFFERENTIAL/PLATELET
Basophils Absolute: 0 10*3/uL (ref 0.0–0.1)
Basophils Relative: 0 %
Eosinophils Absolute: 0.2 10*3/uL (ref 0.0–0.7)
Eosinophils Relative: 2 %
HCT: 35.5 % — ABNORMAL LOW (ref 36.0–46.0)
Hemoglobin: 11.4 g/dL — ABNORMAL LOW (ref 12.0–15.0)
Lymphocytes Relative: 18 %
Lymphs Abs: 2.2 10*3/uL (ref 0.7–4.0)
MCH: 30 pg (ref 26.0–34.0)
MCHC: 32.1 g/dL (ref 30.0–36.0)
MCV: 93.4 fL (ref 78.0–100.0)
Monocytes Absolute: 0.6 10*3/uL (ref 0.1–1.0)
Monocytes Relative: 5 %
Neutro Abs: 9.3 10*3/uL — ABNORMAL HIGH (ref 1.7–7.7)
Neutrophils Relative %: 75 %
Platelets: 421 10*3/uL — ABNORMAL HIGH (ref 150–400)
RBC: 3.8 MIL/uL — ABNORMAL LOW (ref 3.87–5.11)
RDW: 15.7 % — ABNORMAL HIGH (ref 11.5–15.5)
WBC: 12.4 10*3/uL — ABNORMAL HIGH (ref 4.0–10.5)

## 2016-07-21 LAB — BASIC METABOLIC PANEL
Anion gap: 9 (ref 5–15)
BUN: 15 mg/dL (ref 6–20)
CO2: 23 mmol/L (ref 22–32)
Calcium: 9 mg/dL (ref 8.9–10.3)
Chloride: 106 mmol/L (ref 101–111)
Creatinine, Ser: 0.7 mg/dL (ref 0.44–1.00)
GFR calc Af Amer: >60 mL/min (ref >60)
GFR calc non Af Amer: >60 mL/min (ref >60)
Glucose, Bld: 183 mg/dL — ABNORMAL HIGH (ref 65–99)
Potassium: 3.6 mmol/L (ref 3.5–5.1)
Sodium: 138 mmol/L (ref 135–145)

## 2016-07-21 LAB — TROPONIN I: Troponin I: <0.03 ng/mL (ref <0.03)

## 2016-07-21 MED ORDER — ACETAMINOPHEN 325 MG PO TABS
650.0000 mg | ORAL_TABLET | Freq: Once | ORAL | Status: AC
  Administered 2016-07-21: 650 mg via ORAL
  Filled 2016-07-21: qty 2

## 2016-07-21 MED ORDER — DEXTROSE 50 % IV SOLN
INTRAVENOUS | Status: AC
  Administered 2016-07-21: 50 mL
  Filled 2016-07-21: qty 50

## 2016-07-21 MED ORDER — DEXTROSE-NACL 5-0.45 % IV SOLN
INTRAVENOUS | Status: DC
  Administered 2016-07-21: 75 mL/h via INTRAVENOUS

## 2016-07-21 NOTE — ED Triage Notes (Signed)
Husband states he woke pt up around 5pm,  She opened her eyes but would not talk.  Hx of hypoglycemia.  Pt given 1 glucose tab at home.  Pt is alert and oriented at this time.  bg 55.  Given coke in triage

## 2016-07-21 NOTE — ED Provider Notes (Signed)
[facility] Provider Note   CSN: [id number] Arrival date & time: 07/21/16  2027     History   Chief Complaint Chief Complaint  Patient presents with  . Hypoglycemia    HPI [name] is a 71 y.o. female.  HPI Pt was seen at 2055. Per pt and her family, c/o gradual onset and slow improvement of one episode of AMS that occurred approximately 1730 PTA. Pt's family states pt took all her meds at 1500 today, then laid down for a nap. Family found her at 1730 "acting out of it." He states pt "wouldn't talk." He states he gave her a glucose tablet at home with slow improvement of pt's mental status. Pt's family states she is acting per her baseline now. Denies focal motor weakness, no fall, no fevers, no CP/SOB, no abd pain, no N/V/D.   Past Medical History:  Diagnosis Date  . Diabetes (HCC)   . High cholesterol   . Hypertension   . Myocardial infarction (HCC) 2004.    Patient Active Problem List   Diagnosis Date Noted  . HTN (hypertension) 03/04/2015  . Diabetes mellitus (HCC) 03/04/2015  . HLD (hyperlipidemia) 03/04/2015  . Acute blood loss anemia 03/04/2015  . GI bleeding 03/04/2015  . Lower GI bleed 03/04/2015  . GI bleed 03/04/2015  . History of colonic polyps   . Diverticulosis of colon with hemorrhage   . Nausea alone 07/22/2012  . Loss of weight 07/22/2012  . Anemia 07/22/2012    Past Surgical History:  Procedure Laterality Date  . ABDOMINAL HYSTERECTOMY    . BACK SURGERY    . COLONOSCOPY  02/14/09   WUJ:WJXBJYN colon polyp/small internal hemorrhoids, benign polyp  . COLONOSCOPY  02/14/2009   SLF:3-mm sessile sigmoid colon polyp/frequent ascending colon and diverticula/small internal hemorrhoids  . COLONOSCOPY N/A 03/04/2015   Procedure: COLONOSCOPY;  Surgeon: Corbin Ade, MD;  Location: [facility];  Service: Endoscopy;  Laterality: N/A;  . ESOPHAGOGASTRODUODENOSCOPY N/A 02/26/2013   Procedure: ESOPHAGOGASTRODUODENOSCOPY (EGD);  Surgeon:  West Bali, MD;  Location: [facility];  Service: Endoscopy;  Laterality: N/A;  9:30  . NECK SURGERY      OB History    No data available       Home Medications    Prior to Admission medications   Medication Sig Start Date End Date Taking? Authorizing Provider  aspirin EC 81 MG tablet Take 1 tablet (81 mg total) by mouth daily. Restart on 03/12/15 03/05/15  Yes Erick Blinks, MD  glipiZIDE (GLUCOTROL) 10 MG tablet Take 10 mg by mouth daily before breakfast.  02/04/15  Yes [provider]  hydrochlorothiazide (HYDRODIURIL) 25 MG tablet Take 25 mg by mouth daily. 07/05/15  Yes [provider]  metFORMIN (GLUCOPHAGE) 1000 MG tablet Take 1,000 mg by mouth daily with breakfast.  02/04/15  Yes [provider]  nitroGLYCERIN (NITROSTAT) 0.4 MG SL tablet Place 1 tablet (0.4 mg total) under the tongue every 5 (five) minutes as needed for chest pain. 09/04/15  Yes Jodelle Gross, NP  pioglitazone (ACTOS) 45 MG tablet Take 22.5 mg by mouth daily.  02/17/14  Yes [provider]  quinapril (ACCUPRIL) 40 MG tablet Take 40 mg by mouth daily. 07/05/15  Yes [provider]    Family History Family History  Problem Relation Age of Onset  . Colon cancer Neg Hx     Social History Social History  Substance Use Topics  . Smoking status: Former Smoker  Quit date: 03/24/2005  . Smokeless tobacco: Current User  . Alcohol use No     Allergies   Patient has no known allergies.   Review of Systems Review of Systems ROS: Statement: All systems negative except as marked or noted in the HPI; Constitutional: Negative for fever and chills. ; ; Eyes: Negative for eye pain, redness and discharge. ; ; ENMT: Negative for ear pain, hoarseness, nasal congestion, sinus pressure and sore throat. ; ; Cardiovascular: Negative for chest pain, palpitations, diaphoresis, dyspnea and peripheral edema. ; ; Respiratory: Negative for cough, wheezing and stridor. ; ;  Gastrointestinal: Negative for nausea, vomiting, diarrhea, abdominal pain, blood in stool, hematemesis, jaundice and rectal bleeding. . ; ; Genitourinary: Negative for dysuria, flank pain and hematuria. ; ; Musculoskeletal: Negative for back pain and neck pain. Negative for swelling and trauma.; ; Skin: Negative for pruritus, rash, abrasions, blisters, bruising and skin lesion.; ; Neuro: +AMS. Negative for headache, lightheadedness and neck stiffness. Negative for weakness, extremity weakness, paresthesias, involuntary movement, seizure and syncope.       Physical Exam Updated Vital Signs BP (!) 149/57 (BP Location: Right Arm)   Pulse 81   Temp 97 F (36.1 C) (Oral)   Resp 20   Ht 5\' 4"  (1.626 m)   Wt 138 lb (62.6 kg)   SpO2 100%   BMI 23.69 kg/m   Physical Exam 2100: Physical examination:  Nursing notes reviewed; Vital signs and O2 SAT reviewed;  Constitutional: Well developed, Well nourished, Well hydrated, In no acute distress; Head:  Normocephalic, atraumatic; Eyes: EOMI, PERRL, No scleral icterus; ENMT: Mouth and pharynx normal, Mucous membranes moist; Neck: Supple, Full range of motion, No lymphadenopathy; Cardiovascular: Regular rate and rhythm, No gallop; Respiratory: Breath sounds clear & equal bilaterally, No wheezes.  Speaking full sentences with ease, Normal respiratory effort/excursion; Chest: Nontender, Movement normal; Abdomen: Soft, Nontender, Nondistended, Normal bowel sounds; Genitourinary: No CVA tenderness; Extremities: Pulses normal, No tenderness, No edema, No calf edema or asymmetry.; Neuro: AA&Ox3, Major CN grossly intact.  Speech clear. No gross focal motor or sensory deficits in extremities. Pt feeding herself crackers and peanut butter on my arrival to exam room.; Skin: Color normal, Warm, Dry.   ED Treatments / Results  Labs (all labs ordered are listed, but only abnormal results are displayed)   EKG  EKG Interpretation  Date/Time:  Sunday Jul 21 2016  21:18:57 EDT Ventricular Rate:  73 PR Interval:    QRS Duration: 109 QT Interval:  436 QTC Calculation: 481 R Axis:   54 Text Interpretation:  Sinus rhythm When compared with ECG of 08/01/2015 Rate slower Confirmed by [facility]  MD, Nicholos JohnsKATHLEEN [phone]([id number]) on 07/21/2016 9:42:34 PM       Radiology   Procedures Procedures (including critical care time)  Medications Ordered in ED Medications  dextrose 5 %-0.45 % sodium chloride infusion (not administered)  dextrose 50 % solution (50 mLs  Given 07/21/16 2056)     Initial Impression / Assessment and Plan / ED Course  I have reviewed the triage vital signs and the nursing notes.  Pertinent labs & imaging results that were available during my care of the patient were reviewed by me and considered in my medical decision making (see chart for details).  MDM Reviewed: previous chart, nursing note and vitals Reviewed previous: labs and ECG Interpretation: labs, ECG and x-ray    Results for orders placed or performed during the hospital encounter of 07/21/16  Basic metabolic panel  Result Value Ref  Range   Sodium 138 135 - 145 mmol/L   Potassium 3.6 3.5 - 5.1 mmol/L   Chloride 106 101 - 111 mmol/L   CO2 23 22 - 32 mmol/L   Glucose, Bld 183 (H) 65 - 99 mg/dL   BUN 15 6 - 20 mg/dL   Creatinine, Ser 9.60 0.44 - 1.00 mg/dL   Calcium 9.0 8.9 - 45.4 mg/dL   GFR calc non Af Amer >60 >60 mL/min   GFR calc Af Amer >60 >60 mL/min   Anion gap 9 5 - 15  CBC with Differential  Result Value Ref Range   WBC 12.4 (H) 4.0 - 10.5 K/uL   RBC 3.80 (L) 3.87 - 5.11 MIL/uL   Hemoglobin 11.4 (L) 12.0 - 15.0 g/dL   HCT 09.8 (L) 11.9 - 14.7 %   MCV 93.4 78.0 - 100.0 fL   MCH 30.0 26.0 - 34.0 pg   MCHC 32.1 30.0 - 36.0 g/dL   RDW 82.9 (H) 56.2 - 13.0 %   Platelets 421 (H) 150 - 400 K/uL   Neutrophils Relative % 75 %   Neutro Abs 9.3 (H) 1.7 - 7.7 K/uL   Lymphocytes Relative 18 %   Lymphs Abs 2.2 0.7 - 4.0 K/uL   Monocytes Relative 5 %   Monocytes  Absolute 0.6 0.1 - 1.0 K/uL   Eosinophils Relative 2 %   Eosinophils Absolute 0.2 0.0 - 0.7 K/uL   Basophils Relative 0 %   Basophils Absolute 0.0 0.0 - 0.1 K/uL  Troponin I  Result Value Ref Range   Troponin I <0.03 <0.03 ng/mL  Urinalysis, Routine w reflex microscopic  Result Value Ref Range   Color, Urine YELLOW YELLOW   APPearance HAZY (A) CLEAR   Specific Gravity, Urine 1.026 1.005 - 1.030   pH 5.0 5.0 - 8.0   Glucose, UA >=500 (A) NEGATIVE mg/dL   Hgb urine dipstick NEGATIVE NEGATIVE   Bilirubin Urine NEGATIVE NEGATIVE   Ketones, ur NEGATIVE NEGATIVE mg/dL   Protein, ur NEGATIVE NEGATIVE mg/dL   Nitrite NEGATIVE NEGATIVE   Leukocytes, UA MODERATE (A) NEGATIVE   RBC / HPF 6-30 0 - 5 RBC/hpf   WBC, UA TOO NUMEROUS TO COUNT 0 - 5 WBC/hpf   Bacteria, UA NONE SEEN NONE SEEN   Squamous Epithelial / LPF 0-5 (A) NONE SEEN   Mucous PRESENT   CBG monitoring, ED  Result Value Ref Range   Glucose-Capillary 55 (L) 65 - 99 mg/dL  CBG monitoring, ED  Result Value Ref Range   Glucose-Capillary 167 (H) 65 - 99 mg/dL   Dg Chest Port 1 View Result Date: 07/21/2016 CLINICAL DATA:  Altered mental status. EXAM: PORTABLE CHEST 1 VIEW COMPARISON:  08/01/2015 and prior chest radiographs FINDINGS: The cardiomediastinal silhouette is unremarkable. Mild pulmonary vascular congestion is noted. There is no evidence of focal airspace disease, pulmonary edema, suspicious pulmonary nodule/mass, pleural effusion, or pneumothorax. No acute bony abnormalities are identified. IMPRESSION: Mild pulmonary vascular congestion. Electronically Signed   By: Harmon Pier M.D.   On: 07/21/2016 21:46    2210:  Hypoglycemic on arrival with IV D50 bolus given and IV D5 1/2NS started. CBG now stable. No clear UTI on Udip; UC pending. Dx and testing d/w pt and family.  Questions answered.  Verb understanding, agreeable to admit. T/C to [facility]. Robb Matar, case discussed, including:  HPI, pertinent PM/SHx, VS/PE, dx testing, ED  course and treatment:  Agreeable to admit.   Final Clinical Impressions(s) / ED Diagnoses  Final diagnoses:  None    New Prescriptions New Prescriptions   No medications on file     Samuel Jester, DO 07/24/16 1610

## 2016-07-22 ENCOUNTER — Encounter (HOSPITAL_COMMUNITY): Payer: Self-pay | Admitting: Internal Medicine

## 2016-07-22 DIAGNOSIS — E162 Hypoglycemia, unspecified: Secondary | ICD-10-CM

## 2016-07-22 DIAGNOSIS — I5189 Other ill-defined heart diseases: Secondary | ICD-10-CM | POA: Diagnosis present

## 2016-07-22 DIAGNOSIS — I2510 Atherosclerotic heart disease of native coronary artery without angina pectoris: Secondary | ICD-10-CM | POA: Diagnosis present

## 2016-07-22 DIAGNOSIS — E119 Type 2 diabetes mellitus without complications: Secondary | ICD-10-CM

## 2016-07-22 DIAGNOSIS — R8290 Unspecified abnormal findings in urine: Secondary | ICD-10-CM | POA: Diagnosis present

## 2016-07-22 DIAGNOSIS — I10 Essential (primary) hypertension: Secondary | ICD-10-CM

## 2016-07-22 LAB — GLUCOSE, CAPILLARY
Glucose-Capillary: 187 mg/dL — ABNORMAL HIGH (ref 65–99)
Glucose-Capillary: 114 mg/dL — ABNORMAL HIGH (ref 65–99)
Glucose-Capillary: 109 mg/dL — ABNORMAL HIGH (ref 65–99)
Glucose-Capillary: 125 mg/dL — ABNORMAL HIGH (ref 65–99)
Glucose-Capillary: 224 mg/dL — ABNORMAL HIGH (ref 65–99)
Glucose-Capillary: 143 mg/dL — ABNORMAL HIGH (ref 65–99)

## 2016-07-22 LAB — URINALYSIS, ROUTINE W REFLEX MICROSCOPIC
Bacteria, UA: NONE SEEN
Bilirubin Urine: NEGATIVE
Glucose, UA: 150 mg/dL — AB
Hgb urine dipstick: NEGATIVE
Ketones, ur: NEGATIVE mg/dL
Nitrite: NEGATIVE
Protein, ur: NEGATIVE mg/dL
Specific Gravity, Urine: 1.013 (ref 1.005–1.030)
pH: 7 (ref 5.0–8.0)

## 2016-07-22 MED ORDER — QUINAPRIL HCL 10 MG PO TABS
40.0000 mg | ORAL_TABLET | Freq: Every day | ORAL | Status: DC
  Filled 2016-07-22: qty 4

## 2016-07-22 MED ORDER — POTASSIUM CL IN DEXTROSE 5% 20 MEQ/L IV SOLN
20.0000 meq | INTRAVENOUS | Status: DC
  Administered 2016-07-22: 20 meq via INTRAVENOUS
  Filled 2016-07-22: qty 1000

## 2016-07-22 MED ORDER — LISINOPRIL 10 MG PO TABS
40.0000 mg | ORAL_TABLET | Freq: Every day | ORAL | Status: DC
  Administered 2016-07-22: 40 mg via ORAL
  Filled 2016-07-22: qty 4

## 2016-07-22 MED ORDER — NITROGLYCERIN 0.4 MG SL SUBL: 0.4000 mg | SUBLINGUAL_TABLET | SUBLINGUAL | Status: DC | PRN

## 2016-07-22 MED ORDER — ASPIRIN EC 81 MG PO TBEC
81.0000 mg | DELAYED_RELEASE_TABLET | Freq: Every day | ORAL | Status: DC
  Administered 2016-07-22: 81 mg via ORAL
  Filled 2016-07-22: qty 1

## 2016-07-22 MED ORDER — POTASSIUM CL IN DEXTROSE 5% 20 MEQ/L IV SOLN: 20.0000 meq | INTRAVENOUS | Status: DC

## 2016-07-22 MED ORDER — ENSURE ENLIVE PO LIQD
237.0000 mL | Freq: Two times a day (BID) | ORAL | Status: DC
  Administered 2016-07-22: 237 mL via ORAL

## 2016-07-22 MED ORDER — ENOXAPARIN SODIUM 40 MG/0.4ML ~~LOC~~ SOLN: 40.0000 mg | SUBCUTANEOUS | Status: DC

## 2016-07-22 MED ORDER — HYDROCHLOROTHIAZIDE 25 MG PO TABS
25.0000 mg | ORAL_TABLET | Freq: Every day | ORAL | Status: DC
  Administered 2016-07-22: 25 mg via ORAL
  Filled 2016-07-22: qty 1

## 2016-07-22 NOTE — Care Management Obs Status (Signed)
MEDICARE OBSERVATION STATUS NOTIFICATION   Patient Details  Name: Amber GellJacqueline W Jaquith MRN: [id number] Date of Birth: 1945-11-26   Medicare Observation Status Notification Given:  Yes    Malcolm MetroChildress, Astella Desir Demske, RN 07/22/2016, 10:41 AM

## 2016-07-22 NOTE — Care Management Note (Signed)
Case Management Note  Patient Details  Name: [name] MRN: [id number] Date of Birth: [birth year]  Subjective/Objective:                  Pt admitted with hypoglycemia. She is from home, lives with her son. She is ind with ADL's, has PCP, transportation to appointments and insurance with drug coverage. She has no HH or DME needs pta. She communicates no needs at DC.   Action/Plan: Pt discharging home with self care I next 24hrs. No CM needs.   Expected Discharge Date:   07/22/2016               Expected Discharge Plan:  Home/Self Care  In-House Referral:  NA  Discharge planning Services  CM Consult  Post Acute Care Choice:  NA Choice offered to:  NA  Status of Service:  Completed, signed off Malcolm MetroChildress, Zyliah Schier Demske, RN 07/22/2016, 10:42 AM

## 2016-07-22 NOTE — Progress Notes (Signed)
Patient states understanding of discharge instructions.  

## 2016-07-22 NOTE — H&P (Signed)
History and Physical    [name] ZOX:[id number] DOB: 1945-12-22 DOA: 07/21/2016  PCP: Abran Richard, PA-C   Patient coming from: Home.  I have personally briefly reviewed patient's old medical records in [facility]  Chief Complaint: Hypoglycemia.  HPI: [name] is a 71 y.o. female with medical history significant of type 2 diabetes, hyperlipidemia, hypertension, CAD/history of MI who was brought to the emergency department by relatives be a private vehicle due to hypoglycemia.  Per patient and her husband, she did not have breakfast this morning, all other than the single piece of toast. She has not been very hungry today, but earlier in the afternoon was able to eat half a hamburger, half order of french fries and a half lemonade from [facility]. She went home and took all her medications around 1430, since she did not take them in the morning. She is on Actos, glipizide and metformin. She subsequently took a nap and later in the evening was noticed by her husband to be diaphoretic, unable to speak and "acting out of it". He was able to give her a glucose tablet and a Coke. They subsequently brought her to the emergency department, where his CBG showed a glucose of 55 mg/dL. She was given IV dextrose and per family members, her mental status has been back to baseline since then. She denies fever, productive cough, chest pain, dyspnea, palpitations, dizziness, pitting edema lower extremities, abdominal pain, nausea, emesis, diarrhea, constipation, melena or hematochezia. She complains of nocturia, but denies dysuria, hematuria or frequency. She denies skin rashes.   ED Course: She was given dextrose 50% and started on a glucose infusion. Workup shows urinalysis with glucosuria, pyuria, mild hemoglobinuria, but no bacteria seen. WBC of 12.4, hemoglobin 11.4 g/dL and platelets of 811. EKG sinus rhythm, troponin was normal. Sodium 138, potassium 3.6, chloride 106,  bicarbonate 23 mmol/L. BUN was 15, creatinine 0.7, calcium 9.0 and glucose 183 mg/dL. Her chest radiograph showed mild pulmonary vascular congestion.  Review of Systems: As per HPI otherwise 10 point review of systems negative.    Past Medical History:  Diagnosis Date  . Diabetes (HCC)   . High cholesterol   . Hypertension   . Myocardial infarction (HCC) 2004.    Past Surgical History:  Procedure Laterality Date  . ABDOMINAL HYSTERECTOMY    . BACK SURGERY    . CARDIAC CATHETERIZATION  2004  . COLONOSCOPY  02/14/09   BJY:NWGNFAO colon polyp/small internal hemorrhoids, benign polyp  . COLONOSCOPY  02/14/2009   SLF:3-mm sessile sigmoid colon polyp/frequent ascending colon and diverticula/small internal hemorrhoids  . COLONOSCOPY N/A 03/04/2015   Procedure: COLONOSCOPY;  Surgeon: Corbin Ade, MD;  Location: [facility];  Service: Endoscopy;  Laterality: N/A;  . ESOPHAGOGASTRODUODENOSCOPY N/A 02/26/2013   Procedure: ESOPHAGOGASTRODUODENOSCOPY (EGD);  Surgeon: West Bali, MD;  Location: [facility];  Service: Endoscopy;  Laterality: N/A;  9:30  . NECK SURGERY       reports that she quit smoking about 11 years ago. She uses smokeless tobacco. She reports that she does not drink alcohol or use drugs.  Allergies  Allergen Reactions  . Atorvastatin Other (See Comments)    Muscle cramps.    Family History  Problem Relation Age of Onset  . Diabetes Mellitus II Mother   . Lung cancer Father   . Diabetes Mellitus II Sister   . Diabetes Mellitus II Sister   . Colon cancer Neg Hx     Prior  to Admission medications   Medication Sig Start Date End Date Taking? Authorizing Provider  aspirin EC 81 MG tablet Take 1 tablet (81 mg total) by mouth daily. Restart on 03/12/15 03/05/15  Yes Erick Blinks, MD  glipiZIDE (GLUCOTROL) 10 MG tablet Take 5 mg by mouth 2 (two) times daily.  02/04/15  Yes [provider]  hydrochlorothiazide (HYDRODIURIL) 25 MG tablet Take 25 mg by  mouth daily. 07/05/15  Yes [provider]  metFORMIN (GLUCOPHAGE) 1000 MG tablet Take 1,000 mg by mouth daily with breakfast.  02/04/15  Yes [provider]  nitroGLYCERIN (NITROSTAT) 0.4 MG SL tablet Place 1 tablet (0.4 mg total) under the tongue every 5 (five) minutes as needed for chest pain. 09/04/15  Yes Jodelle Gross, NP  pioglitazone (ACTOS) 45 MG tablet Take 22.5 mg by mouth daily.  02/17/14  Yes [provider]  quinapril (ACCUPRIL) 40 MG tablet Take 40 mg by mouth daily. 07/05/15  Yes [provider]    Physical Exam: Vitals:   07/21/16 2043 07/21/16 2230 07/22/16 0000 07/22/16 0115  BP: (!) 149/57 (!) 150/74 (!) 144/67 (!) 150/63  Pulse: 81 83 80 77  Resp: 20 19 17 16   Temp: 97 F (36.1 C)   97.7 F (36.5 C)  TempSrc: Oral   Oral  SpO2: 100% 100% 100% 100%  Weight: 62.6 kg (138 lb)     Height: 5\' 4"  (1.626 m)      Constitutional: NAD, calm, comfortable Eyes: PERRL, lids and conjunctivae normal ENMT: Mucous membranes are moist. Posterior pharynx clear of any exudate or lesions. Absent dentition.  Neck: normal, supple, no masses, no thyromegaly Respiratory: clear to auscultation bilaterally, no wheezing, no crackles. Normal respiratory effort. No accessory muscle use.  Cardiovascular: Regular rate and rhythm, no murmurs / rubs / gallops. No extremity edema. 2+ pedal pulses. No carotid bruits.  Abdomen: no tenderness, no masses palpated. No hepatosplenomegaly. Bowel sounds positive.  Musculoskeletal: no clubbing / cyanosis.Good ROM, no contractures. Normal muscle tone.  Skin: no rashes, lesions, ulcers. No induration Neurologic: CN 2-12 grossly intact. Sensation intact, DTR normal. Strength 5/5 in all 4.  Psychiatric: Normal judgment and insight. Alert and oriented x 3. Normal mood.    Labs on Admission: I have personally reviewed following labs and imaging studies  CBC:  Recent Labs Lab 07/21/16 2139  WBC 12.4*  NEUTROABS 9.3*   HGB 11.4*  HCT 35.5*  MCV 93.4  PLT 421*   Basic Metabolic Panel:  Recent Labs Lab 07/21/16 2139  NA 138  K 3.6  CL 106  CO2 23  GLUCOSE 183*  BUN 15  CREATININE 0.70  CALCIUM 9.0   GFR: Estimated Creatinine Clearance: 58.1 mL/min (by C-G formula based on SCr of 0.7 mg/dL). Liver Function Tests: No results for input(s): AST, ALT, ALKPHOS, BILITOT, PROT, ALBUMIN in the last 168 hours. No results for input(s): LIPASE, AMYLASE in the last 168 hours. No results for input(s): AMMONIA in the last 168 hours. Coagulation Profile: No results for input(s): INR, PROTIME in the last 168 hours. Cardiac Enzymes:  Recent Labs Lab 07/21/16 2139  TROPONINI <0.03   BNP (last 3 results) No results for input(s): PROBNP in the last 8760 hours. HbA1C: No results for input(s): HGBA1C in the last 72 hours. CBG:  Recent Labs Lab 07/21/16 2041 07/21/16 2217  GLUCAP 55* 167*   Lipid Profile: No results for input(s): CHOL, HDL, LDLCALC, TRIG, CHOLHDL, LDLDIRECT in the last 72 hours. Thyroid Function Tests: No  results for input(s): TSH, T4TOTAL, FREET4, T3FREE, THYROIDAB in the last 72 hours. Anemia Panel: No results for input(s): VITAMINB12, FOLATE, FERRITIN, TIBC, IRON, RETICCTPCT in the last 72 hours. Urine analysis:    Component Value Date/Time   COLORURINE YELLOW 07/21/2016 2142   APPEARANCEUR HAZY (A) 07/21/2016 2142   LABSPEC 1.026 07/21/2016 2142   PHURINE 5.0 07/21/2016 2142   GLUCOSEU >=500 (A) 07/21/2016 2142   HGBUR NEGATIVE 07/21/2016 2142   BILIRUBINUR NEGATIVE 07/21/2016 2142   KETONESUR NEGATIVE 07/21/2016 2142   PROTEINUR NEGATIVE 07/21/2016 2142   UROBILINOGEN 0.2 05/31/2007 1408   NITRITE NEGATIVE 07/21/2016 2142   LEUKOCYTESUR MODERATE (A) 07/21/2016 2142     Radiological Exams on Admission: Dg Chest Port 1 View  Result Date: 07/21/2016 CLINICAL DATA:  Altered mental status. EXAM: PORTABLE CHEST 1 VIEW COMPARISON:  08/01/2015 and prior chest  radiographs FINDINGS: The cardiomediastinal silhouette is unremarkable. Mild pulmonary vascular congestion is noted. There is no evidence of focal airspace disease, pulmonary edema, suspicious pulmonary nodule/mass, pleural effusion, or pneumothorax. No acute bony abnormalities are identified. IMPRESSION: Mild pulmonary vascular congestion. Electronically Signed   By: Harmon Pier M.D.   On: 07/21/2016 21:46   08/31/2015 Nuclear medicine stress test with wall motion and EF.   Defect 1: There is a medium defect of moderate severity present in the basal anteroseptal, basal inferoseptal, basal inferior, mid anteroseptal, mid inferoseptal and mid inferior location. There is a mild degree of mid-anteroseptal ischemia. Soft tissue attenuation artifact also playing a role.  Nuclear stress EF: 44%.  Findings consistent with prior myocardial infarction with peri-infarct ischemia. This is an intermediate risk study.  09/04/2015 echocardiogram  ------------------------------------------------------------------- LV EF: 60% -   65%  ------------------------------------------------------------------- Indications:      Chest pain 786.51.  ------------------------------------------------------------------- History:   Risk factors:  Hypertension. Diabetes mellitus. Dyslipidemia.  ------------------------------------------------------------------- Study Conclusions  - Left ventricle: The cavity size was normal. Wall thickness was   increased in a pattern of mild LVH. Systolic function was normal.   The estimated ejection fraction was in the range of 60% to 65%.   Wall motion was normal; there were no regional wall motion   abnormalities. Doppler parameters are consistent with abnormal   left ventricular relaxation (grade 1 diastolic dysfunction). - Aortic valve: Valve area (VTI): 1.77 cm^2. Valve area (Vmax):   1.63 cm^2. - Atrial septum: No defect or patent foramen ovale was identified. -  Technically adequate study.  EKG: Independently reviewed Vent. rate 73 BPM PR interval * ms QRS duration 109 ms QT/QTc 436/481 ms P-R-T axes 70 54 58 Sinus rhythm.  Assessment/Plan Principal Problem:   Hypoglycemia Admit to telemetry/observation. Continue glucose infusion. Hold glipizide, metformin and Actos. CBG monitoring every 2 hours.  Active Problems:   Abnormal urinalysis No fever or urinary symptoms. However due to pyuria on previous urinalysis, will repeat in a.m. The patient was given instructions on how to do a clean catch urine specimen.    HTN (hypertension) Continue hydrochlorothiazide 25 mg by mouth daily. Continue quinapril 40 mg by mouth daily. Monitor blood pressure, electrolytes and renal function.    HLD (hyperlipidemia) Unable to tolerate statins. Recommended high fiber diet and psyllium supplementation.    CAD (coronary artery disease) Not on a beta blocker. Continue aspirin and ACE inhibitor. Unable to tolerate statins.    Diastolic dysfunction. Continue low-sodium diet. On ACE inhibitor and diuretic. Consider antihypertensive adjustment to rule out beta blocker as an outpatient.   DVT prophylaxis: Lovenox SQ. Code  Status: Full code. Family Communication:  Disposition Plan: Continue glucose infusion and CBG monitoring. Consults called:  Admission status: Observation/telemetry.    Bobette Moavid Manuel Charmion Hapke MD [facility] Pager [phone].  If 7PM-7AM, please contact night-coverage [email or web address] Password [email or web address]  07/22/2016, 1:18 AM

## 2016-07-22 NOTE — Discharge Summary (Signed)
Physician Discharge Summary  [name] ZOX:[id number] DOB: 10/02/1945 DOA: 07/21/2016  PCP: Alvina Filbert, MD  Admit date: 07/21/2016 Discharge date: 07/22/2016  Recommendations for Outpatient Follow-up:  1. Ongoing care for diabetes mellitus type 2.I asked her to check her blood sugar 3 times a day for the next few days and call for hypoglycemia.   Discharge Diagnoses:  1. Hypoglycemia with associated acute encephalopathy 2. Diabetes mellitus type 2 not on insulin 3. Asymptomatic bacteriuria 4. Essential hypertension  Discharge Condition: Improved Disposition: Home  Diet recommendation: Diabetic diet  Filed Weights   07/21/16 2043  Weight: 62.6 kg (138 lb)    History of present illness:  71 year old woman PMH diabetes mellitus type 2 presented with acute encephalopathy, hypoglycemia. She was treated with IV dextrose with resolution of encephalopathy in the emergency department.  Hospital Course:  Admitted overnight, treated with dextrose infusion with resolution of hypoglycemia. Patient ate very little yesterday and took her antihyperglycemic so anyway, thus explaining this episode of hypoglycemia. No evidence to suggest CNS process or other acute event. Of note she has no dysuria or hesitancy thus is considered to have a symptomatic bacteriuria. No treatments indicated. Hospitalization was uncomplicated. Dextrose infusion was discontinued, she tolerated breakfast and lines without recurrent hypoglycemia and is now stable for discharge.  #1: Hypoglycemia with associated acute encephalopathy secondary to glipizide and pioglitazone.  #2: Asymptomatic bacteriuria. No indication for treatment  #3: Diabetes mellitus type 2 glipizide, metformin and pioglitazone  #4: Hypertension. Stable. Continue quinapril and hydrochlorothiazide.  Today's assessment: S: Feels back to normal. No complaints. O: Vitals: Afebrile, temperature 90.7, respirations 16, pulse 64, blood  pressure 128/54, SPO2 under percent room air.   Constitutional: Appears calm, comfortable, lying flat in bed.  Cardiovascular: Regular rate and rhythm. No murmur, rub or gallop.  Respiratory: Clear to auscultation bilaterally. No wheezes, rales or rhonchi. Normal respiratory effort.  Psychiatric: Grossly normal mood and affect. Speech fluent and appropriate.  I have personally reviewed the following:   Labs:  Last episode of hypoglycemia 2041 yesterday. Last 3 blood sugars 114, 109, 125.  Basic metabolic panel unremarkable.  Troponin negative.  WBC modestly elevated, 12.4. Hemoglobin stable 11.4.  Urinalysis grossly abnormal.  Imaging studies:  Chest x-ray independently reviewed, no acute disease. Radiologist interpretation reviewed, pulmonary vascular congestion.  Medical tests:  EKG inability reviewed, sinus rhythm, no acute changes   Discharge Instructions  Discharge Instructions    Diet - low sodium heart healthy    Complete by:  As directed    Discharge instructions    Complete by:  As directed    Call your physician or seek immediate medical attention for blood sugar less than 70 or greater than 400. Check your blood sugar three times per day.   Increase activity slowly    Complete by:  As directed      Allergies as of 07/22/2016      Reactions   Atorvastatin Other (See Comments)   Muscle cramps.      Medication List    TAKE these medications   aspirin EC 81 MG tablet Take 1 tablet (81 mg total) by mouth daily. Restart on 03/12/15   glipiZIDE 10 MG tablet Commonly known as:  GLUCOTROL Take 5 mg by mouth 2 (two) times daily.   hydrochlorothiazide 25 MG tablet Commonly known as:  HYDRODIURIL Take 25 mg by mouth daily.   metFORMIN 1000 MG tablet Commonly known as:  GLUCOPHAGE Take 1,000 mg by mouth daily with breakfast.  nitroGLYCERIN 0.4 MG SL tablet Commonly known as:  NITROSTAT Place 1 tablet (0.4 mg total) under the tongue every 5 (five)  minutes as needed for chest pain.   pioglitazone 45 MG tablet Commonly known as:  ACTOS Take 22.5 mg by mouth daily.   quinapril 40 MG tablet Commonly known as:  ACCUPRIL Take 40 mg by mouth daily.      Allergies  Allergen Reactions  . Atorvastatin Other (See Comments)    Muscle cramps.    The results of significant diagnostics from this hospitalization (including imaging, microbiology, ancillary and laboratory) are listed below for reference.    Significant Diagnostic Studies: Dg Chest Port 1 View  Result Date: 07/21/2016 CLINICAL DATA:  Altered mental status. EXAM: PORTABLE CHEST 1 VIEW COMPARISON:  08/01/2015 and prior chest radiographs FINDINGS: The cardiomediastinal silhouette is unremarkable. Mild pulmonary vascular congestion is noted. There is no evidence of focal airspace disease, pulmonary edema, suspicious pulmonary nodule/mass, pleural effusion, or pneumothorax. No acute bony abnormalities are identified. IMPRESSION: Mild pulmonary vascular congestion. Electronically Signed   By: Harmon PierJeffrey  Hu M.D.   On: 07/21/2016 21:46   Labs: Basic Metabolic Panel:  Recent Labs Lab 07/21/16 2139  NA 138  K 3.6  CL 106  CO2 23  GLUCOSE 183*  BUN 15  CREATININE 0.70  CALCIUM 9.0   CBC:  Recent Labs Lab 07/21/16 2139  WBC 12.4*  NEUTROABS 9.3*  HGB 11.4*  HCT 35.5*  MCV 93.4  PLT 421*   Cardiac Enzymes:  Recent Labs Lab 07/21/16 2139  TROPONINI <0.03   CBG:  Recent Labs Lab 07/22/16 0404 07/22/16 0620 07/22/16 0800 07/22/16 1013 07/22/16 1221  GLUCAP 114* 109* 125* 224* 143*    Principal Problem:   Hypoglycemia Active Problems:   HTN (hypertension)   HLD (hyperlipidemia)   CAD (coronary artery disease)   Abnormal urinalysis   Diastolic dysfunction   Time coordinating discharge: 35 minutes  Signed:  Brendia Sacksaniel Damarious Holtsclaw, MD [facility] 07/22/2016, 3:13 PM

## 2016-07-23 LAB — URINE CULTURE: Culture: <10000 — AB

## 2017-02-22 ENCOUNTER — Encounter (HOSPITAL_COMMUNITY): Payer: Self-pay | Admitting: *Deleted

## 2017-02-22 ENCOUNTER — Other Ambulatory Visit: Payer: Self-pay

## 2017-02-22 ENCOUNTER — Emergency Department (HOSPITAL_COMMUNITY)
Admission: EM | Admit: 2017-02-22 | Discharge: 2017-02-22 | Disposition: A | Discharge status: Home / Self Care | Payer: Medicare HMO | Attending: Emergency Medicine | Admitting: Emergency Medicine

## 2017-02-22 ENCOUNTER — Emergency Department (HOSPITAL_COMMUNITY): Payer: Medicare HMO

## 2017-02-22 DIAGNOSIS — Z87891 Personal history of nicotine dependence: Secondary | ICD-10-CM | POA: Insufficient documentation

## 2017-02-22 DIAGNOSIS — Z79899 Other long term (current) drug therapy: Secondary | ICD-10-CM | POA: Diagnosis not present

## 2017-02-22 DIAGNOSIS — E119 Type 2 diabetes mellitus without complications: Secondary | ICD-10-CM | POA: Diagnosis not present

## 2017-02-22 DIAGNOSIS — I119 Hypertensive heart disease without heart failure: Secondary | ICD-10-CM | POA: Insufficient documentation

## 2017-02-22 DIAGNOSIS — M79602 Pain in left arm: Secondary | ICD-10-CM | POA: Diagnosis present

## 2017-02-22 DIAGNOSIS — Z7984 Long term (current) use of oral hypoglycemic drugs: Secondary | ICD-10-CM | POA: Insufficient documentation

## 2017-02-22 DIAGNOSIS — M79622 Pain in left upper arm: Secondary | ICD-10-CM | POA: Diagnosis not present

## 2017-02-22 MED ORDER — TRAMADOL HCL 50 MG PO TABS
50.0000 mg | ORAL_TABLET | Freq: Once | ORAL | Status: AC
  Administered 2017-02-22: 50 mg via ORAL
  Filled 2017-02-22: qty 1

## 2017-02-22 MED ORDER — IBUPROFEN 400 MG PO TABS
400.0000 mg | ORAL_TABLET | Freq: Once | ORAL | Status: AC
  Administered 2017-02-22: 400 mg via ORAL
  Filled 2017-02-22: qty 1

## 2017-02-22 MED ORDER — TRAMADOL HCL 50 MG PO TABS: 50.0000 mg | ORAL_TABLET | Freq: Four times a day (QID) | ORAL | 0 refills | Status: DC | PRN

## 2017-02-22 NOTE — ED Notes (Signed)
Pt reports L arm pain from shoulder to elbow  No know injury   Worse at night  No relief from pain meds

## 2017-02-22 NOTE — Discharge Instructions (Signed)
Alternate ice and heat to your left upper arm.  Follow-up with your primary doctor or with Dr. Romeo AppleHarrison for recheck.  Return to the ER for any worsening symptoms.

## 2017-02-22 NOTE — ED Triage Notes (Signed)
Pt with upper left arm pain for 2 weeks. Pt denies any injury to arm.

## 2017-02-24 NOTE — ED Provider Notes (Signed)
[facility] EMERGENCY DEPARTMENT Provider Note   CSN: [id number] Arrival date & time: 02/22/17  1248     History   Chief Complaint Chief Complaint  Patient presents with  . Arm Pain    HPI Amber GellJacqueline W Minich is a 71 y.o. female.  HPI  [name] is a 71 y.o. female who presents to the Emergency Department complaining of left arm and shoulder pain for 2 weeks.  No known injury.  She describes an aching pain that is constant but worse with reaching movements. Pain radiates from the shoulder to the upper arm with movement.   She denies neck pain, chest or jaw pain, numbness or weakness of the extremities.  She has tried OTC medications with some relief.     Past Medical History:  Diagnosis Date  . Diabetes (HCC)   . High cholesterol   . Hypertension   . Myocardial infarction (HCC) 2004.    Patient Active Problem List   Diagnosis Date Noted  . CAD (coronary artery disease) 07/22/2016  . Abnormal urinalysis 07/22/2016  . Diastolic dysfunction 07/22/2016  . Hypoglycemia 07/21/2016  . HTN (hypertension) 03/04/2015  . Diabetes mellitus (HCC) 03/04/2015  . HLD (hyperlipidemia) 03/04/2015  . Acute blood loss anemia 03/04/2015  . GI bleeding 03/04/2015  . Lower GI bleed 03/04/2015  . GI bleed 03/04/2015  . History of colonic polyps   . Diverticulosis of colon with hemorrhage   . Nausea alone 07/22/2012  . Loss of weight 07/22/2012  . Anemia 07/22/2012    Past Surgical History:  Procedure Laterality Date  . ABDOMINAL HYSTERECTOMY    . BACK SURGERY    . CARDIAC CATHETERIZATION  2004  . COLONOSCOPY  02/14/09   NWG:NFAOZHYSLF:sigmoid colon polyp/small internal hemorrhoids, benign polyp  . COLONOSCOPY  02/14/2009   SLF:3-mm sessile sigmoid colon polyp/frequent ascending colon and diverticula/small internal hemorrhoids  . COLONOSCOPY N/A 03/04/2015   Procedure: COLONOSCOPY;  Surgeon: Corbin Adeobert M Rourk, MD;  Location: [facility];  Service: Endoscopy;  Laterality: N/A;  .  ESOPHAGOGASTRODUODENOSCOPY N/A 02/26/2013   Procedure: ESOPHAGOGASTRODUODENOSCOPY (EGD);  Surgeon: West BaliSandi L Fields, MD;  Location: [facility] ENDO SUITE;  Service: Endoscopy;  Laterality: N/A;  9:30  . NECK SURGERY      OB History    No data available       Home Medications    Prior to Admission medications   Medication Sig Start Date End Date Taking? Authorizing Provider  amLODipine (NORVASC) 2.5 MG tablet Take 2.5 mg by mouth daily. 01/20/17  Yes [provider]  aspirin EC 81 MG tablet Take 1 tablet (81 mg total) by mouth daily. Restart on 03/12/15 03/05/15  Yes Erick BlinksMemon, Jehanzeb, MD  metFORMIN (GLUCOPHAGE) 1000 MG tablet Take 1,000 mg by mouth 2 (two) times daily with a meal.  02/04/15  Yes [provider]  nitroGLYCERIN (NITROSTAT) 0.4 MG SL tablet Place 1 tablet (0.4 mg total) under the tongue every 5 (five) minutes as needed for chest pain. 09/04/15  Yes Jodelle GrossLawrence, Kathryn M, NP  pioglitazone (ACTOS) 45 MG tablet Take 45 mg by mouth daily.  02/17/14  Yes [provider]  quinapril (ACCUPRIL) 40 MG tablet Take 40 mg by mouth daily. 07/05/15  Yes [provider]  traMADol (ULTRAM) 50 MG tablet Take 1 tablet (50 mg total) by mouth every 6 (six) hours as needed. 02/22/17   Pauline Ausriplett, Karenna Romanoff, PA-C    Family History Family History  Problem Relation Age of Onset  . Diabetes Mellitus II  Mother   . Lung cancer Father   . Diabetes Mellitus II Sister   . Diabetes Mellitus II Sister   . Colon cancer Neg Hx     Social History Social History   Tobacco Use  . Smoking status: Former Smoker    Last attempt to quit: 03/24/2005    Years since quitting: 11.9  . Smokeless tobacco: Current User  Substance Use Topics  . Alcohol use: No    Alcohol/week: 0.0 oz  . Drug use: No     Allergies   Atorvastatin   Review of Systems Review of Systems  Constitutional: Negative for chills and fever.  Respiratory: Negative for chest tightness and shortness of breath.     Cardiovascular: Negative for chest pain.  Musculoskeletal: Positive for arthralgias (left shoulder pain). Negative for back pain, joint swelling and neck pain.  Skin: Negative for color change and rash.  Neurological: Negative for dizziness, syncope, weakness, numbness and headaches.  All other systems reviewed and are negative.    Physical Exam Updated Vital Signs BP 127/66 (BP Location: Left Arm)   Pulse 79   Temp 99.4 F (37.4 C) (Oral)   Resp 16   Ht 5\' 1"  (1.549 m)   Wt 65.8 kg (145 lb)   SpO2 98%   BMI 27.40 kg/m   Physical Exam  Constitutional: She is oriented to person, place, and time. She appears well-developed and well-nourished. No distress.  HENT:  Head: Atraumatic.  Mouth/Throat: Oropharynx is clear and moist.  Neck: Normal range of motion and full passive range of motion without pain. Neck supple. No spinous process tenderness and no muscular tenderness present. Normal range of motion present.  Cardiovascular: Normal rate, regular rhythm and intact distal pulses.  Pulmonary/Chest: Effort normal and breath sounds normal. No respiratory distress.  Musculoskeletal: She exhibits tenderness. She exhibits no edema or deformity.  ttp of the anterior and lateral left shoulder joint.  Pain reproduced with rotation and abduction of the left arm.  No edema.  Grip strength strong and symmetrical.    Neurological: She is alert and oriented to person, place, and time. No sensory deficit.  Skin: Skin is warm. Capillary refill takes less than 2 seconds.  Psychiatric: She has a normal mood and affect.  Nursing note and vitals reviewed.    ED Treatments / Results  Labs (all labs ordered are listed, but only abnormal results are displayed) Labs Reviewed - No data to display  EKG  EKG Interpretation None       Radiology Dg Humerus Left  Result Date: 02/22/2017 CLINICAL DATA:  Left arm pain EXAM: LEFT HUMERUS - 2+ VIEW COMPARISON:  None. FINDINGS: Mild degenerative  changes in the [facility] and glenohumeral joints with joint space narrowing and spurring. No acute bony abnormality. Specifically, no fracture, subluxation, or dislocation. Soft tissues are intact. IMPRESSION: No acute bony abnormality. Electronically Signed   By: Charlett NoseKevin  Dover M.D.   On: 02/22/2017 14:05     Procedures Procedures (including critical care time)  Medications Ordered in ED Medications  traMADol (ULTRAM) tablet 50 mg (50 mg Oral Given 02/22/17 1626)  ibuprofen (ADVIL,MOTRIN) tablet 400 mg (400 mg Oral Given 02/22/17 1625)     Initial Impression / Assessment and Plan / ED Course  I have reviewed the triage vital signs and the nursing notes.  Pertinent labs & imaging results that were available during my care of the patient were reviewed by me and considered in my medical decision making (see chart  for details).     Pain to the left shoulder reproduced with movement.  XR shows some spurring of the [facility] joint.   NV intact.  Pain likely inflammatory.  Pt agrees to symptomatic tx and prefers to f/u with local orthopedics.  Appears stable for d/c home.   Final Clinical Impressions(s) / ED Diagnoses   Final diagnoses:  Pain of left upper arm    ED Discharge Orders        Ordered    traMADol (ULTRAM) 50 MG tablet  Every 6 hours PRN     02/22/17 1623       Pauline Aus, PA-C 02/24/17 2329    Vanetta Mulders, MD 02/28/17 1549

## 2017-03-18 ENCOUNTER — Other Ambulatory Visit (HOSPITAL_COMMUNITY): Payer: Self-pay | Admitting: Internal Medicine

## 2017-03-18 DIAGNOSIS — Z1231 Encounter for screening mammogram for malignant neoplasm of breast: Secondary | ICD-10-CM

## 2017-03-20 ENCOUNTER — Ambulatory Visit (HOSPITAL_COMMUNITY)
Admission: RE | Admit: 2017-03-20 | Discharge: 2017-03-20 | Disposition: A | Discharge status: Home / Self Care | Payer: Medicare HMO | Source: Ambulatory Visit | Attending: Internal Medicine | Admitting: Internal Medicine

## 2017-03-20 DIAGNOSIS — Z1231 Encounter for screening mammogram for malignant neoplasm of breast: Secondary | ICD-10-CM | POA: Diagnosis present

## 2017-07-26 ENCOUNTER — Encounter (HOSPITAL_COMMUNITY): Payer: Self-pay | Admitting: Emergency Medicine

## 2017-07-26 ENCOUNTER — Emergency Department (HOSPITAL_COMMUNITY): Payer: Medicare HMO

## 2017-07-26 ENCOUNTER — Emergency Department (HOSPITAL_COMMUNITY)
Admission: EM | Admit: 2017-07-26 | Discharge: 2017-07-26 | Disposition: A | Discharge status: Home / Self Care | Payer: Medicare HMO | Attending: Emergency Medicine | Admitting: Emergency Medicine

## 2017-07-26 ENCOUNTER — Other Ambulatory Visit: Payer: Self-pay

## 2017-07-26 DIAGNOSIS — R059 Cough, unspecified: Secondary | ICD-10-CM

## 2017-07-26 DIAGNOSIS — E119 Type 2 diabetes mellitus without complications: Secondary | ICD-10-CM | POA: Diagnosis not present

## 2017-07-26 DIAGNOSIS — I10 Essential (primary) hypertension: Secondary | ICD-10-CM | POA: Diagnosis not present

## 2017-07-26 DIAGNOSIS — Z79899 Other long term (current) drug therapy: Secondary | ICD-10-CM | POA: Diagnosis not present

## 2017-07-26 DIAGNOSIS — R05 Cough: Secondary | ICD-10-CM | POA: Insufficient documentation

## 2017-07-26 DIAGNOSIS — Z87891 Personal history of nicotine dependence: Secondary | ICD-10-CM | POA: Insufficient documentation

## 2017-07-26 MED ORDER — BENZONATATE 100 MG PO CAPS: 100.0000 mg | ORAL_CAPSULE | Freq: Three times a day (TID) | ORAL | 0 refills | Status: DC

## 2017-07-26 NOTE — Discharge Instructions (Addendum)
Please continue taking your antibiotic as prescribed for the full duration.  Take Tessalon per as needed for cough.  Return if you develop shortness of breath, or worsening of your symptoms.

## 2017-07-26 NOTE — ED Provider Notes (Signed)
[facility] EMERGENCY DEPARTMENT Provider Note   CSN: [id number] Arrival date & time: 07/26/17  1905     History   Chief Complaint Chief Complaint  Patient presents with  . Cough    HPI [name] is a 72 y.o. female.  HPI   72 year old female with history of hypertension, diabetes presenting for evaluation of cough.  Patient report for the past 5 days she has had persistent cough, throat irritation, occasional wheezing.  Her cough has increased in frequency with increased production.  Throat irritation has improved.  However, cough is keeping her up at night.  At urgent care 3 days ago for her complaint.  Her chest x-ray and a rapid strep test was obtained at that visit.  She was told that she has "a touch of pneumonia" and was prescribed Levaquin.  She has been taking the antibiotic as prescribed however cough to persist.  She denies any associated fever, chills, pleuritic chest pain, shortness of breath, nausea vomiting diarrhea, back pain.  No prior history of PE or DVT, no recent surgery, prolonged bedrest, unilateral leg swelling or calf pain, active cancer, or hemoptysis.  She denies any other medication changes.  Past Medical History:  Diagnosis Date  . Diabetes (HCC)   . High cholesterol   . Hypertension   . Myocardial infarction (HCC) 2004.    Patient Active Problem List   Diagnosis Date Noted  . CAD (coronary artery disease) 07/22/2016  . Abnormal urinalysis 07/22/2016  . Diastolic dysfunction 07/22/2016  . Hypoglycemia 07/21/2016  . HTN (hypertension) 03/04/2015  . Diabetes mellitus (HCC) 03/04/2015  . HLD (hyperlipidemia) 03/04/2015  . Acute blood loss anemia 03/04/2015  . GI bleeding 03/04/2015  . Lower GI bleed 03/04/2015  . GI bleed 03/04/2015  . History of colonic polyps   . Diverticulosis of colon with hemorrhage   . Nausea alone 07/22/2012  . Loss of weight 07/22/2012  . Anemia 07/22/2012    Past Surgical History:    Procedure Laterality Date  . ABDOMINAL HYSTERECTOMY    . BACK SURGERY    . CARDIAC CATHETERIZATION  2004  . COLONOSCOPY  02/14/09   ION:GEXBMWU colon polyp/small internal hemorrhoids, benign polyp  . COLONOSCOPY  02/14/2009   SLF:3-mm sessile sigmoid colon polyp/frequent ascending colon and diverticula/small internal hemorrhoids  . COLONOSCOPY N/A 03/04/2015   Procedure: COLONOSCOPY;  Surgeon: Corbin Ade, MD;  Location: [facility];  Service: Endoscopy;  Laterality: N/A;  . ESOPHAGOGASTRODUODENOSCOPY N/A 02/26/2013   Procedure: ESOPHAGOGASTRODUODENOSCOPY (EGD);  Surgeon: West Bali, MD;  Location: [facility];  Service: Endoscopy;  Laterality: N/A;  9:30  . NECK SURGERY       OB History   None      Home Medications    Prior to Admission medications   Medication Sig Start Date End Date Taking? Authorizing Provider  amLODipine (NORVASC) 2.5 MG tablet Take 2.5 mg by mouth daily. 01/20/17   [provider]  aspirin EC 81 MG tablet Take 1 tablet (81 mg total) by mouth daily. Restart on 03/12/15 03/05/15   Erick Blinks, MD  metFORMIN (GLUCOPHAGE) 1000 MG tablet Take 1,000 mg by mouth 2 (two) times daily with a meal.  02/04/15   [provider]  nitroGLYCERIN (NITROSTAT) 0.4 MG SL tablet Place 1 tablet (0.4 mg total) under the tongue every 5 (five) minutes as needed for chest pain. 09/04/15   Jodelle Gross, NP  pioglitazone (ACTOS) 45 MG tablet Take 45 mg  by mouth daily.  02/17/14   [provider]  quinapril (ACCUPRIL) 40 MG tablet Take 40 mg by mouth daily. 07/05/15   [provider]  traMADol (ULTRAM) 50 MG tablet Take 1 tablet (50 mg total) by mouth every 6 (six) hours as needed. 02/22/17   Pauline Aus, PA-C    Family History Family History  Problem Relation Age of Onset  . Diabetes Mellitus II Mother   . Lung cancer Father   . Diabetes Mellitus II Sister   . Diabetes Mellitus II Sister   . Colon cancer Neg Hx      Social History Social History   Tobacco Use  . Smoking status: Former Smoker    Last attempt to quit: 03/24/2005    Years since quitting: 12.3  . Smokeless tobacco: Current User  Substance Use Topics  . Alcohol use: No    Alcohol/week: 0.0 oz  . Drug use: No     Allergies   Atorvastatin   Review of Systems Review of Systems  All other systems reviewed and are negative.    Physical Exam Updated Vital Signs BP 139/87 (BP Location: Right Arm)   Pulse 100   Temp 98.5 F (36.9 C) (Oral)   Resp 16   SpO2 96%   Physical Exam  Constitutional: She is oriented to person, place, and time. She appears well-developed and well-nourished. No distress.  Patient is well-appearing, sitting in a chair, in no acute discomfort.  HENT:  Head: Atraumatic.  Mouth: She is wearing partial dentures.  Uvula midline no tonsillar enlargement or exudates, no trismus.  Eyes: Conjunctivae are normal.  Neck: Neck supple. No JVD present. No tracheal deviation present.  Cardiovascular: Normal rate, regular rhythm and intact distal pulses.  Pulmonary/Chest: Effort normal and breath sounds normal. No stridor. She has no wheezes. She has no rales.  Abdominal: Soft.  Musculoskeletal: She exhibits no edema.  Lymphadenopathy:    She has no cervical adenopathy.  Neurological: She is alert and oriented to person, place, and time.  Skin: No rash noted.  Psychiatric: She has a normal mood and affect.  Nursing note and vitals reviewed.    ED Treatments / Results  Labs (all labs ordered are listed, but only abnormal results are displayed) Labs Reviewed - No data to display  EKG None  Radiology Dg Chest 2 View  Result Date: 07/26/2017 CLINICAL DATA:  Acute cough. EXAM: CHEST - 2 VIEW COMPARISON:  07/21/2016 and prior exams FINDINGS: The cardiomediastinal silhouette is unremarkable. There is no evidence of focal airspace disease, pulmonary edema, suspicious pulmonary nodule/mass, pleural  effusion, or pneumothorax. No acute bony abnormalities are identified. IMPRESSION: No active cardiopulmonary disease. Electronically Signed   By: Harmon Pier M.D.   On: 07/26/2017 20:02    Procedures Procedures (including critical care time)  Medications Ordered in ED Medications - No data to display   Initial Impression / Assessment and Plan / ED Course  I have reviewed the triage vital signs and the nursing notes.  Pertinent labs & imaging results that were available during my care of the patient were reviewed by me and considered in my medical decision making (see chart for details).     BP 124/68 (BP Location: Right Arm)   Pulse 100   Temp 99.6 F (37.6 C) (Oral)   Resp 18   SpO2 97%    Final Clinical Impressions(s) / ED Diagnoses   Final diagnoses:  Cough    ED Discharge Orders  Ordered    benzonatate (TESSALON) 100 MG capsule  Every 8 hours     07/26/17 2107     9:04 PM Patient here with persistent cough.  Was seen at urgent care several days prior and had an x-ray which show signs of pneumonia.  She is currently on Levaquin.  A repeat chest x-ray today showing no acute abnormalities.  She is not hypoxic.  She is on lisinopril however her cough is productive and not likely to be medication induced cough.  Although I cannot PERC her due to her age, she does not have any significant risk factor to suggest PE.  She denies any shortness of breath.  At this time, patient will be discharged home with additional cough medication.  I encourage patient to continue with her antibiotic for the full duration.  Return precautions discussed.   Fayrene Helper, PA-C 07/26/17 2108    Vanetta Mulders, MD 07/31/17 [phone]

## 2017-07-26 NOTE — ED Notes (Signed)
Patient able to ambulate independently  

## 2017-07-26 NOTE — ED Triage Notes (Signed)
Pt was seen at urgent care thursday for cough. Had a throat swab and x-ray and was told she may "have a touch of the pneumonia"  Pt "coughed all night".  Has been taking levaquin and was given a "shot" at urgent care.  Has not had a fever.

## 2018-04-03 ENCOUNTER — Emergency Department (HOSPITAL_COMMUNITY): Payer: Medicare HMO

## 2018-04-03 ENCOUNTER — Encounter (HOSPITAL_COMMUNITY): Payer: Self-pay | Admitting: *Deleted

## 2018-04-03 ENCOUNTER — Emergency Department (HOSPITAL_COMMUNITY)
Admission: EM | Admit: 2018-04-03 | Discharge: 2018-04-03 | Disposition: A | Discharge status: Home / Self Care | Payer: Medicare HMO | Attending: Emergency Medicine | Admitting: Emergency Medicine

## 2018-04-03 DIAGNOSIS — R05 Cough: Secondary | ICD-10-CM | POA: Diagnosis present

## 2018-04-03 DIAGNOSIS — I2510 Atherosclerotic heart disease of native coronary artery without angina pectoris: Secondary | ICD-10-CM | POA: Insufficient documentation

## 2018-04-03 DIAGNOSIS — R059 Cough, unspecified: Secondary | ICD-10-CM

## 2018-04-03 DIAGNOSIS — I5030 Unspecified diastolic (congestive) heart failure: Secondary | ICD-10-CM | POA: Diagnosis not present

## 2018-04-03 DIAGNOSIS — Z7982 Long term (current) use of aspirin: Secondary | ICD-10-CM | POA: Insufficient documentation

## 2018-04-03 DIAGNOSIS — Z7984 Long term (current) use of oral hypoglycemic drugs: Secondary | ICD-10-CM | POA: Diagnosis not present

## 2018-04-03 DIAGNOSIS — E119 Type 2 diabetes mellitus without complications: Secondary | ICD-10-CM | POA: Diagnosis not present

## 2018-04-03 DIAGNOSIS — I110 Hypertensive heart disease with heart failure: Secondary | ICD-10-CM | POA: Insufficient documentation

## 2018-04-03 DIAGNOSIS — Z87891 Personal history of nicotine dependence: Secondary | ICD-10-CM | POA: Insufficient documentation

## 2018-04-03 LAB — CBC WITH DIFFERENTIAL/PLATELET
Abs Immature Granulocytes: 0.04 10*3/uL (ref 0.00–0.07)
Basophils Absolute: 0 10*3/uL (ref 0.0–0.1)
Basophils Relative: 0 %
Eosinophils Absolute: 0.1 10*3/uL (ref 0.0–0.5)
Eosinophils Relative: 1 %
HCT: 37.6 % (ref 36.0–46.0)
Hemoglobin: 11.3 g/dL — ABNORMAL LOW (ref 12.0–15.0)
Immature Granulocytes: 1 %
Lymphocytes Relative: 16 %
Lymphs Abs: 1.3 10*3/uL (ref 0.7–4.0)
MCH: 29 pg (ref 26.0–34.0)
MCHC: 30.1 g/dL (ref 30.0–36.0)
MCV: 96.7 fL (ref 80.0–100.0)
Monocytes Absolute: 0.8 10*3/uL (ref 0.1–1.0)
Monocytes Relative: 10 %
Neutro Abs: 6.2 10*3/uL (ref 1.7–7.7)
Neutrophils Relative %: 72 %
Platelets: 346 10*3/uL (ref 150–400)
RBC: 3.89 MIL/uL (ref 3.87–5.11)
RDW: 15.4 % (ref 11.5–15.5)
WBC: 8.4 10*3/uL (ref 4.0–10.5)
nRBC: 0 % (ref 0.0–0.2)

## 2018-04-03 LAB — BASIC METABOLIC PANEL
Anion gap: 13 (ref 5–15)
BUN: 10 mg/dL (ref 8–23)
CO2: 23 mmol/L (ref 22–32)
Calcium: 9.2 mg/dL (ref 8.9–10.3)
Chloride: 99 mmol/L (ref 98–111)
Creatinine, Ser: 0.69 mg/dL (ref 0.44–1.00)
GFR calc Af Amer: >60 mL/min (ref >60)
GFR calc non Af Amer: >60 mL/min (ref >60)
Glucose, Bld: 135 mg/dL — ABNORMAL HIGH (ref 70–99)
Potassium: 3.2 mmol/L — ABNORMAL LOW (ref 3.5–5.1)
Sodium: 135 mmol/L (ref 135–145)

## 2018-04-03 LAB — URINALYSIS, ROUTINE W REFLEX MICROSCOPIC
Bilirubin Urine: NEGATIVE
Glucose, UA: 150 mg/dL — AB
Hgb urine dipstick: NEGATIVE
Ketones, ur: NEGATIVE mg/dL
Leukocytes, UA: NEGATIVE
Nitrite: NEGATIVE
Protein, ur: NEGATIVE mg/dL
Specific Gravity, Urine: 1.012 (ref 1.005–1.030)
pH: 7 (ref 5.0–8.0)

## 2018-04-03 LAB — LACTIC ACID, PLASMA
Lactic Acid, Venous: 1.3 mmol/L (ref 0.5–1.9)
Lactic Acid, Venous: 1.3 mmol/L (ref 0.5–1.9)

## 2018-04-03 LAB — INFLUENZA PANEL BY PCR (TYPE A & B)
Influenza A By PCR: NEGATIVE
Influenza B By PCR: NEGATIVE

## 2018-04-03 MED ORDER — ACETAMINOPHEN 325 MG PO TABS
650.0000 mg | ORAL_TABLET | Freq: Once | ORAL | Status: AC
  Administered 2018-04-03: 650 mg via ORAL
  Filled 2018-04-03: qty 2

## 2018-04-03 MED ORDER — POTASSIUM CHLORIDE CRYS ER 20 MEQ PO TBCR
40.0000 meq | EXTENDED_RELEASE_TABLET | Freq: Once | ORAL | Status: AC
  Administered 2018-04-03: 40 meq via ORAL
  Filled 2018-04-03: qty 2

## 2018-04-03 MED ORDER — IPRATROPIUM-ALBUTEROL 0.5-2.5 (3) MG/3ML IN SOLN
3.0000 mL | Freq: Once | RESPIRATORY_TRACT | Status: AC
  Administered 2018-04-03: 3 mL via RESPIRATORY_TRACT
  Filled 2018-04-03: qty 3

## 2018-04-03 MED ORDER — BENZONATATE 100 MG PO CAPS: 100.0000 mg | ORAL_CAPSULE | Freq: Three times a day (TID) | ORAL | 0 refills | Status: DC | PRN

## 2018-04-03 MED ORDER — SODIUM CHLORIDE 0.9 % IV BOLUS
500.0000 mL | Freq: Once | INTRAVENOUS | Status: AC
  Administered 2018-04-03: 500 mL via INTRAVENOUS

## 2018-04-03 MED ORDER — ALBUTEROL SULFATE HFA 108 (90 BASE) MCG/ACT IN AERS: 2.0000 | INHALATION_SPRAY | RESPIRATORY_TRACT | 0 refills | Status: DC | PRN

## 2018-04-03 NOTE — ED Provider Notes (Signed)
[facility] EMERGENCY DEPARTMENT Provider Note   CSN: [id number] Arrival date & time: 04/03/18  1707     History   Chief Complaint Chief Complaint  Patient presents with  . Cough    HPI [name] is a 73 y.o. female.  HPI  Pt was seen at 1933.  Per pt, c/o gradual onset and persistence of constant cough for the past 2 days.  Has been associated with generalized body aches. Denies fevers, no rash, no CP/SOB, no N/V/D, no abd pain, no sore throat.      Past Medical History:  Diagnosis Date  . Diabetes (HCC)   . High cholesterol   . Hypertension   . Myocardial infarction (HCC) 2004.    Patient Active Problem List   Diagnosis Date Noted  . CAD (coronary artery disease) 07/22/2016  . Abnormal urinalysis 07/22/2016  . Diastolic dysfunction 07/22/2016  . Hypoglycemia 07/21/2016  . HTN (hypertension) 03/04/2015  . Diabetes mellitus (HCC) 03/04/2015  . HLD (hyperlipidemia) 03/04/2015  . Acute blood loss anemia 03/04/2015  . GI bleeding 03/04/2015  . Lower GI bleed 03/04/2015  . GI bleed 03/04/2015  . History of colonic polyps   . Diverticulosis of colon with hemorrhage   . Nausea alone 07/22/2012  . Loss of weight 07/22/2012  . Anemia 07/22/2012    Past Surgical History:  Procedure Laterality Date  . ABDOMINAL HYSTERECTOMY    . BACK SURGERY    . CARDIAC CATHETERIZATION  2004  . COLONOSCOPY  02/14/09   MBW:[id number] colon polyp/small internal hemorrhoids, benign polyp  . COLONOSCOPY  02/14/2009   SLF:3-mm sessile sigmoid colon polyp/frequent ascending colon and diverticula/small internal hemorrhoids  . COLONOSCOPY N/A 03/04/2015   Procedure: COLONOSCOPY;  Surgeon: Corbin Ade, MD;  Location: [facility];  Service: Endoscopy;  Laterality: N/A;  . ESOPHAGOGASTRODUODENOSCOPY N/A 02/26/2013   Procedure: ESOPHAGOGASTRODUODENOSCOPY (EGD);  Surgeon: West Bali, MD;  Location: [facility];  Service: Endoscopy;  Laterality: N/A;  9:30  . NECK SURGERY        OB History   No obstetric history on file.      Home Medications    Prior to Admission medications   Medication Sig Start Date End Date Taking? Authorizing Provider  amLODipine (NORVASC) 2.5 MG tablet Take 2.5 mg by mouth daily. 01/20/17   [provider]  aspirin EC 81 MG tablet Take 1 tablet (81 mg total) by mouth daily. Restart on 03/12/15 03/05/15   Erick Blinks, MD  benzonatate (TESSALON) 100 MG capsule Take 1 capsule (100 mg total) by mouth every 8 (eight) hours. 07/26/17   Fayrene Helper, PA-C  metFORMIN (GLUCOPHAGE) 1000 MG tablet Take 1,000 mg by mouth 2 (two) times daily with a meal.  02/04/15   [provider]  nitroGLYCERIN (NITROSTAT) 0.4 MG SL tablet Place 1 tablet (0.4 mg total) under the tongue every 5 (five) minutes as needed for chest pain. 09/04/15   Jodelle Gross, NP  pioglitazone (ACTOS) 45 MG tablet Take 45 mg by mouth daily.  02/17/14   [provider]  quinapril (ACCUPRIL) 40 MG tablet Take 40 mg by mouth daily. 07/05/15   [provider]  traMADol (ULTRAM) 50 MG tablet Take 1 tablet (50 mg total) by mouth every 6 (six) hours as needed. 02/22/17   Pauline Aus, PA-C    Family History Family History  Problem Relation Age of Onset  . Diabetes Mellitus II Mother   . Lung cancer Father   .  Diabetes Mellitus II Sister   . Diabetes Mellitus II Sister   . Colon cancer Neg Hx     Social History Social History   Tobacco Use  . Smoking status: Former Smoker    Last attempt to quit: 03/24/2005    Years since quitting: 13.0  . Smokeless tobacco: Never Used  Substance Use Topics  . Alcohol use: No    Alcohol/week: 0.0 standard drinks  . Drug use: No     Allergies   Atorvastatin   Review of Systems Review of Systems ROS: Statement: All systems negative except as marked or noted in the HPI; Constitutional: Negative for fever and chills. +generalized body aches.; ; Eyes: Negative for eye pain, redness and  discharge. ; ; ENMT: Negative for ear pain, hoarseness, nasal congestion, sinus pressure and sore throat. ; ; Cardiovascular: Negative for chest pain, palpitations, diaphoresis, dyspnea and peripheral edema. ; ; Respiratory: +cough. Negative for wheezing and stridor. ; ; Gastrointestinal: Negative for nausea, vomiting, diarrhea, abdominal pain, blood in stool, hematemesis, jaundice and rectal bleeding. . ; ; Genitourinary: Negative for dysuria, flank pain and hematuria. ; ; Musculoskeletal: Negative for back pain and neck pain. Negative for swelling and trauma.; ; Skin: Negative for pruritus, rash, abrasions, blisters, bruising and skin lesion.; ; Neuro: Negative for headache, lightheadedness and neck stiffness. Negative for weakness, altered level of consciousness, altered mental status, extremity weakness, paresthesias, involuntary movement, seizure and syncope.       Physical Exam Updated Vital Signs BP (!) 154/66 (BP Location: Right Arm)   Pulse (!) 113   Temp (!) 100.6 F (38.1 C) (Oral)   Resp (!) 22   Ht 5\' 1"  (1.549 m)   Wt 62.6 kg   SpO2 99%   BMI 26.07 kg/m    BP 116/66   Pulse 88   Temp 99.5 F (37.5 C) (Oral)   Resp 20   Ht 5\' 1"  (1.549 m)   Wt 62.6 kg   SpO2 94%   BMI 26.07 kg/m    Physical Exam 1938: Physical examination:  Nursing notes reviewed; Vital signs and O2 SAT reviewed;  Constitutional: Well developed, Well nourished, Well hydrated, In no acute distress; Head:  Normocephalic, atraumatic; Eyes: EOMI, PERRL, No scleral icterus; ENMT: TM's clear bilat. +edemetous nasal turbinates bilat with clear rhinorrhea. Mouth and pharynx without lesions. No tonsillar exudates. No intra-oral edema. No submandibular or sublingual edema. No hoarse voice, no drooling, no stridor. No pain with manipulation of larynx. No trismus. Mouth and pharynx normal, Mucous membranes moist; Neck: Supple, Full range of motion, No lymphadenopathy. No meningeal signs.; Cardiovascular: Regular rate  and rhythm, No gallop; Respiratory: Breath sounds clear & equal bilaterally, No wheezes.  Speaking full sentences with ease, Normal respiratory effort/excursion; Chest: Nontender, Movement normal; Abdomen: Soft, Nontender, Nondistended, Normal bowel sounds; Genitourinary: No CVA tenderness; Extremities: Peripheral pulses normal, No tenderness, No edema, No calf edema or asymmetry.; Neuro: AA&Ox3, Major CN grossly intact.  Speech clear. No gross focal motor or sensory deficits in extremities.; Skin: Color normal, Warm, Dry.   ED Treatments / Results  Labs (all labs ordered are listed, but only abnormal results are displayed)   EKG None  Radiology   Procedures Procedures (including critical care time)  Medications Ordered in ED Medications  acetaminophen (TYLENOL) tablet 650 mg (has no administration in time range)  sodium chloride 0.9 % bolus 500 mL (has no administration in time range)  ipratropium-albuterol (DUONEB) 0.5-2.5 (3) MG/3ML nebulizer solution 3 mL (3 mLs  Nebulization Given 04/03/18 1948)     Initial Impression / Assessment and Plan / ED Course  I have reviewed the triage vital signs and the nursing notes.  Pertinent labs & imaging results that were available during my care of the patient were reviewed by me and considered in my medical decision making (see chart for details).  MDM Reviewed: previous chart, nursing note and vitals Reviewed previous: labs Interpretation: labs and x-ray    Results for orders placed or performed during the hospital encounter of 04/03/18  Culture, blood (routine x 2)  Result Value Ref Range   Specimen Description RIGHT ANTECUBITAL    Special Requests      BOTTLES DRAWN AEROBIC AND ANAEROBIC Blood Culture adequate volume Performed at [facility], [address] [place], [place] [address]    Culture PENDING    Report Status PENDING   Culture, blood (routine x 2)  Result Value Ref Range   Specimen Description LEFT ANTECUBITAL     Special Requests      BOTTLES DRAWN AEROBIC AND ANAEROBIC Blood Culture adequate volume Performed at [facility], [address], [place], [place] [address]    Culture PENDING    Report Status PENDING   Urinalysis, Routine w reflex microscopic  Result Value Ref Range   Color, Urine STRAW (A) YELLOW   APPearance CLEAR CLEAR   Specific Gravity, Urine 1.012 1.005 - 1.030   pH 7.0 5.0 - 8.0   Glucose, UA 150 (A) NEGATIVE mg/dL   Hgb urine dipstick NEGATIVE NEGATIVE   Bilirubin Urine NEGATIVE NEGATIVE   Ketones, ur NEGATIVE NEGATIVE mg/dL   Protein, ur NEGATIVE NEGATIVE mg/dL   Nitrite NEGATIVE NEGATIVE   Leukocytes, UA NEGATIVE NEGATIVE  Basic metabolic panel  Result Value Ref Range   Sodium 135 135 - 145 mmol/L   Potassium 3.2 (L) 3.5 - 5.1 mmol/L   Chloride 99 98 - 111 mmol/L   CO2 23 22 - 32 mmol/L   Glucose, Bld 135 (H) 70 - 99 mg/dL   BUN 10 8 - 23 mg/dL   Creatinine, Ser 0.98 0.44 - 1.00 mg/dL   Calcium 9.2 8.9 - 11.9 mg/dL   GFR calc non Af Amer >60 >60 mL/min   GFR calc Af Amer >60 >60 mL/min   Anion gap 13 5 - 15  CBC with Differential  Result Value Ref Range   WBC 8.4 4.0 - 10.5 K/uL   RBC 3.89 3.87 - 5.11 MIL/uL   Hemoglobin 11.3 (L) 12.0 - 15.0 g/dL   HCT 14.7 82.9 - 56.2 %   MCV 96.7 80.0 - 100.0 fL   MCH 29.0 26.0 - 34.0 pg   MCHC 30.1 30.0 - 36.0 g/dL   RDW 13.0 86.5 - 78.4 %   Platelets 346 150 - 400 K/uL   nRBC 0.0 0.0 - 0.2 %   Neutrophils Relative % 72 %   Neutro Abs 6.2 1.7 - 7.7 K/uL   Lymphocytes Relative 16 %   Lymphs Abs 1.3 0.7 - 4.0 K/uL   Monocytes Relative 10 %   Monocytes Absolute 0.8 0.1 - 1.0 K/uL   Eosinophils Relative 1 %   Eosinophils Absolute 0.1 0.0 - 0.5 K/uL   Basophils Relative 0 %   Basophils Absolute 0.0 0.0 - 0.1 K/uL   Immature Granulocytes 1 %   Abs Immature Granulocytes 0.04 0.00 - 0.07 K/uL  Influenza panel by PCR (type A & B)  Result Value Ref Range   Influenza A By PCR  NEGATIVE NEGATIVE   Influenza B By PCR  NEGATIVE NEGATIVE  Lactic acid, plasma  Result Value Ref Range   Lactic Acid, Venous 1.3 0.5 - 1.9 mmol/L  Lactic acid, plasma  Result Value Ref Range   Lactic Acid, Venous 1.3 0.5 - 1.9 mmol/L   Dg Chest 2 View Result Date: 04/03/2018 CLINICAL DATA:  Cough and fever EXAM: CHEST - 2 VIEW COMPARISON:  07/26/2017 FINDINGS: Cardiac shadows within normal limits. Aortic calcifications are noted. Mild linear density is noted in the left base consistent with platelike atelectasis. No sizable effusion is noted. Degenerative changes of the thoracic spine are noted. IMPRESSION: Mild platelike atelectasis in the left base. Electronically Signed   By: Alcide Clever M.D.   On: 04/03/2018 18:01    2245:  Potassium repleted PO. Pt has tol PO well without N/V. Short neb given for cough. IVF bolus and APAP given for low grade fever and tachycardia with improvement. WBC count, lactic acid x2 negative. Workup reassuring. VS per baseline. Pt states she feels better and wants to go home now. No clear indication for admission at this time. BC and UC pending; strict return precautions given.  Dx and testing d/w pt and family.  Questions answered.  Verb understanding, agreeable to d/c home with outpt f/u.    Final Clinical Impressions(s) / ED Diagnoses   Final diagnoses:  None    ED Discharge Orders    None       Samuel Jester, DO 04/06/18 1343

## 2018-04-03 NOTE — ED Notes (Signed)
Patient given discharge instruction, verbalized understand. IV removed, band aid applied. Patient ambulatory out of the department.  

## 2018-04-03 NOTE — ED Notes (Signed)
Patient ambulated to restroom. Patient states that she felt dizzy. Returned patient to room via wheelchair. Vitals taken at this time.

## 2018-04-03 NOTE — ED Notes (Signed)
Pt states she has had a non productive dry cough for the past few days. States she has also felt achy.

## 2018-04-03 NOTE — ED Triage Notes (Signed)
Pt cough since yesterday, denies fever at home. np cough.

## 2018-04-03 NOTE — Discharge Instructions (Signed)
Take over the counter tylenol, as directed on packaging, as needed for discomfort or fever. Take the prescriptions as directed. Increase your fluids for the next several days.  Call your regular medical doctor on Monday to schedule a follow up appointment in the next 3 days.  Return to the Emergency Department immediately if worsening.

## 2018-04-05 LAB — URINE CULTURE: Culture: <10000 — AB

## 2018-04-08 LAB — CULTURE, BLOOD (ROUTINE X 2)
Culture: NO GROWTH
Culture: NO GROWTH
Special Requests: ADEQUATE
Special Requests: ADEQUATE

## 2018-05-15 ENCOUNTER — Other Ambulatory Visit (HOSPITAL_COMMUNITY): Payer: Self-pay | Admitting: Internal Medicine

## 2018-05-15 DIAGNOSIS — Z1231 Encounter for screening mammogram for malignant neoplasm of breast: Secondary | ICD-10-CM

## 2018-05-27 ENCOUNTER — Other Ambulatory Visit: Payer: Self-pay

## 2018-05-27 ENCOUNTER — Ambulatory Visit (HOSPITAL_COMMUNITY)
Admission: RE | Admit: 2018-05-27 | Discharge: 2018-05-27 | Disposition: A | Discharge status: Home / Self Care | Payer: Medicare HMO | Source: Ambulatory Visit | Attending: Internal Medicine | Admitting: Internal Medicine

## 2018-05-27 DIAGNOSIS — Z1231 Encounter for screening mammogram for malignant neoplasm of breast: Secondary | ICD-10-CM | POA: Diagnosis not present

## 2018-06-28 IMAGING — CR DG CHEST 1V PORT
1 series · 1 of 1 positions shown · non-contrast
Comparison: 08/01/2015 and prior chest radiographs

CLINICAL DATA: Altered mental status.

EXAM:
PORTABLE CHEST 1 VIEW

[portable]
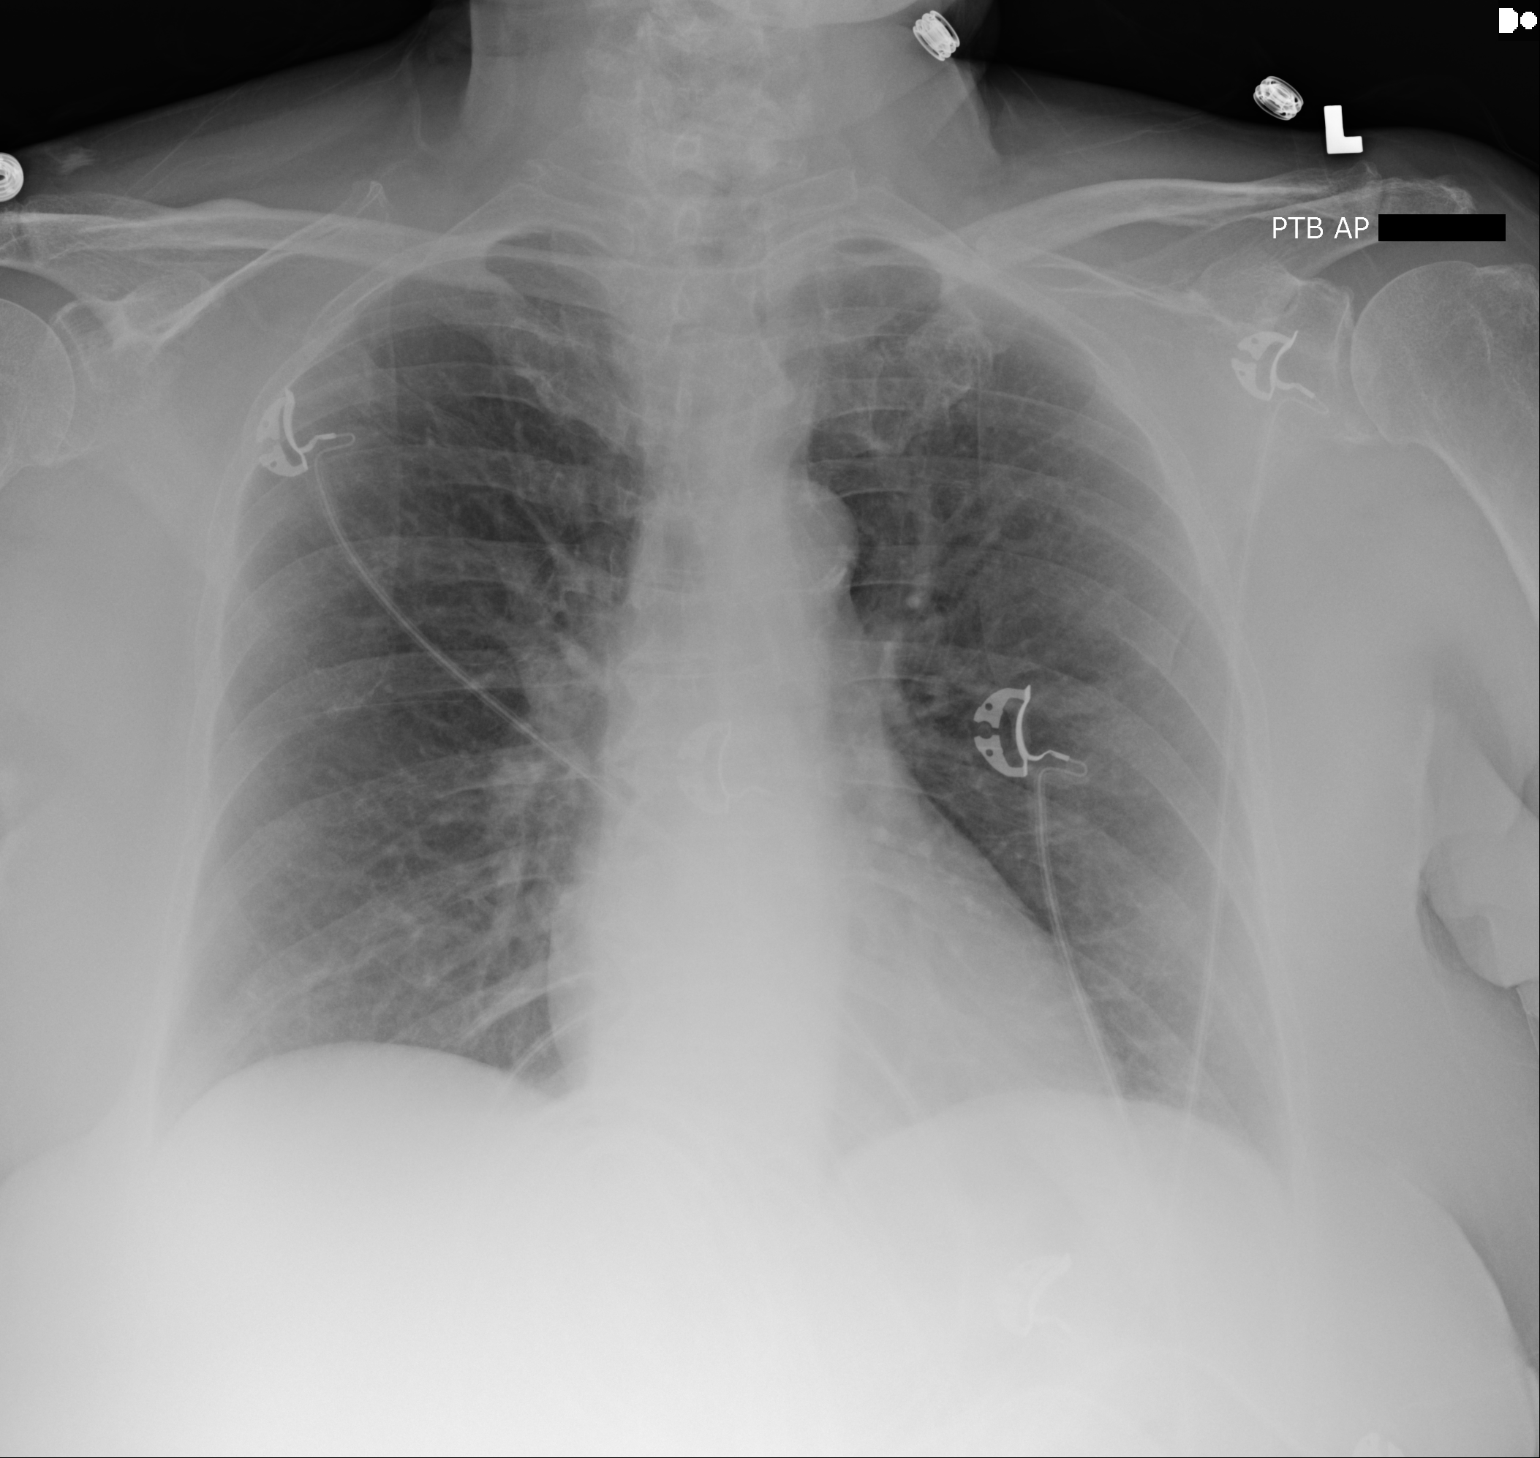

[1 of 1 positions shown; findings below may reference images not displayed]

FINDINGS: The cardiomediastinal silhouette is unremarkable.

Mild pulmonary vascular congestion is noted.

There is no evidence of focal airspace disease, pulmonary edema,
suspicious pulmonary nodule/mass, pleural effusion, or pneumothorax.

No acute bony abnormalities are identified.
IMPRESSION: Mild pulmonary vascular congestion.

## 2019-04-29 ENCOUNTER — Emergency Department (HOSPITAL_COMMUNITY): Payer: Medicare HMO

## 2019-04-29 ENCOUNTER — Other Ambulatory Visit: Payer: Self-pay

## 2019-04-29 ENCOUNTER — Encounter (HOSPITAL_COMMUNITY): Payer: Self-pay | Admitting: Emergency Medicine

## 2019-04-29 ENCOUNTER — Emergency Department (HOSPITAL_COMMUNITY)
Admission: EM | Admit: 2019-04-29 | Discharge: 2019-04-29 | Disposition: A | Discharge status: Home / Self Care | Payer: Medicare HMO | Attending: Emergency Medicine | Admitting: Emergency Medicine

## 2019-04-29 DIAGNOSIS — E119 Type 2 diabetes mellitus without complications: Secondary | ICD-10-CM | POA: Diagnosis not present

## 2019-04-29 DIAGNOSIS — Z7984 Long term (current) use of oral hypoglycemic drugs: Secondary | ICD-10-CM | POA: Insufficient documentation

## 2019-04-29 DIAGNOSIS — Z87891 Personal history of nicotine dependence: Secondary | ICD-10-CM | POA: Insufficient documentation

## 2019-04-29 DIAGNOSIS — Y999 Unspecified external cause status: Secondary | ICD-10-CM | POA: Insufficient documentation

## 2019-04-29 DIAGNOSIS — X58XXXA Exposure to other specified factors, initial encounter: Secondary | ICD-10-CM | POA: Diagnosis not present

## 2019-04-29 DIAGNOSIS — S39012A Strain of muscle, fascia and tendon of lower back, initial encounter: Secondary | ICD-10-CM | POA: Insufficient documentation

## 2019-04-29 DIAGNOSIS — I10 Essential (primary) hypertension: Secondary | ICD-10-CM | POA: Diagnosis not present

## 2019-04-29 DIAGNOSIS — Y939 Activity, unspecified: Secondary | ICD-10-CM | POA: Insufficient documentation

## 2019-04-29 DIAGNOSIS — Z7982 Long term (current) use of aspirin: Secondary | ICD-10-CM | POA: Diagnosis not present

## 2019-04-29 DIAGNOSIS — S3992XA Unspecified injury of lower back, initial encounter: Secondary | ICD-10-CM | POA: Diagnosis present

## 2019-04-29 DIAGNOSIS — Y929 Unspecified place or not applicable: Secondary | ICD-10-CM | POA: Insufficient documentation

## 2019-04-29 LAB — URINALYSIS, ROUTINE W REFLEX MICROSCOPIC
Bilirubin Urine: NEGATIVE
Glucose, UA: NEGATIVE mg/dL
Hgb urine dipstick: NEGATIVE
Ketones, ur: NEGATIVE mg/dL
Leukocytes,Ua: NEGATIVE
Nitrite: NEGATIVE
Protein, ur: NEGATIVE mg/dL
Specific Gravity, Urine: 1.008 (ref 1.005–1.030)
pH: 5 (ref 5.0–8.0)

## 2019-04-29 LAB — CBC WITH DIFFERENTIAL/PLATELET
Abs Immature Granulocytes: 0.01 10*3/uL (ref 0.00–0.07)
Basophils Absolute: 0 10*3/uL (ref 0.0–0.1)
Basophils Relative: 0 %
Eosinophils Absolute: 0.2 10*3/uL (ref 0.0–0.5)
Eosinophils Relative: 3 %
HCT: 38.7 % (ref 36.0–46.0)
Hemoglobin: 11.9 g/dL — ABNORMAL LOW (ref 12.0–15.0)
Immature Granulocytes: 0 %
Lymphocytes Relative: 46 %
Lymphs Abs: 3 10*3/uL (ref 0.7–4.0)
MCH: 31.1 pg (ref 26.0–34.0)
MCHC: 30.7 g/dL (ref 30.0–36.0)
MCV: 101 fL — ABNORMAL HIGH (ref 80.0–100.0)
Monocytes Absolute: 0.5 10*3/uL (ref 0.1–1.0)
Monocytes Relative: 7 %
Neutro Abs: 3 10*3/uL (ref 1.7–7.7)
Neutrophils Relative %: 44 %
Platelets: 351 10*3/uL (ref 150–400)
RBC: 3.83 MIL/uL — ABNORMAL LOW (ref 3.87–5.11)
RDW: 14.9 % (ref 11.5–15.5)
WBC: 6.7 10*3/uL (ref 4.0–10.5)
nRBC: 0 % (ref 0.0–0.2)

## 2019-04-29 LAB — COMPREHENSIVE METABOLIC PANEL
ALT: 9 U/L (ref 0–44)
AST: 15 U/L (ref 15–41)
Albumin: 3.7 g/dL (ref 3.5–5.0)
Alkaline Phosphatase: 76 U/L (ref 38–126)
Anion gap: 10 (ref 5–15)
BUN: 14 mg/dL (ref 8–23)
CO2: 24 mmol/L (ref 22–32)
Calcium: 9 mg/dL (ref 8.9–10.3)
Chloride: 103 mmol/L (ref 98–111)
Creatinine, Ser: 0.7 mg/dL (ref 0.44–1.00)
GFR calc Af Amer: >60 mL/min (ref >60)
GFR calc non Af Amer: >60 mL/min (ref >60)
Glucose, Bld: 122 mg/dL — ABNORMAL HIGH (ref 70–99)
Potassium: 3.8 mmol/L (ref 3.5–5.1)
Sodium: 137 mmol/L (ref 135–145)
Total Bilirubin: 0.5 mg/dL (ref 0.3–1.2)
Total Protein: 7.2 g/dL (ref 6.5–8.1)

## 2019-04-29 LAB — LIPASE, BLOOD: Lipase: 25 U/L (ref 11–51)

## 2019-04-29 MED ORDER — NAPROXEN 500 MG PO TABS: 500.0000 mg | ORAL_TABLET | Freq: Two times a day (BID) | ORAL | 0 refills | Status: DC | PRN

## 2019-04-29 MED ORDER — FENTANYL CITRATE (PF) 100 MCG/2ML IJ SOLN
50.0000 ug | Freq: Once | INTRAMUSCULAR | Status: AC
  Administered 2019-04-29: 21:00:00 50 ug via INTRAVENOUS
  Filled 2019-04-29: qty 2

## 2019-04-29 MED ORDER — IOHEXOL 300 MG/ML  SOLN
100.0000 mL | Freq: Once | INTRAMUSCULAR | Status: AC | PRN
  Administered 2019-04-29: 100 mL via INTRAVENOUS

## 2019-04-29 MED ORDER — CYCLOBENZAPRINE HCL 5 MG PO TABS: 5.0000 mg | ORAL_TABLET | Freq: Three times a day (TID) | ORAL | 0 refills | Status: DC | PRN

## 2019-04-29 NOTE — Discharge Instructions (Signed)
Please take Tylenol, prescribed naproxen and Flexeril as needed for pain control.  Please schedule follow-up appointment with your primary doctor to discuss the symptoms you are experiencing today.  If your pain worsens, you develop any numbness, weakness, bladder or bowel incontinence, fever or other new concerning symptom, please return to ER for reassessment.

## 2019-04-29 NOTE — ED Provider Notes (Signed)
[facility] EMERGENCY DEPARTMENT Provider Note   CSN: [id number] Arrival date & time: 04/29/19  1727     History Chief Complaint  Patient presents with  . Back Pain    [name] is a 74 y.o. female. Presents to ER with back pain.  Pain has been going on for the past week or so.  No known injury, not sudden onset, seems to be worsening over the last few days.  Worse with certain positions.  No recent heavy lifting.  No dysuria, no hematuria, no fever, no vomiting.  No IVDU hx, no saddle anesthesia, no bladder or bowel incontinence.   HPI     Past Medical History:  Diagnosis Date  . Diabetes (HCC)   . High cholesterol   . Hypertension   . Myocardial infarction (HCC) 2004.    Patient Active Problem List   Diagnosis Date Noted  . CAD (coronary artery disease) 07/22/2016  . Abnormal urinalysis 07/22/2016  . Diastolic dysfunction 07/22/2016  . Hypoglycemia 07/21/2016  . HTN (hypertension) 03/04/2015  . Diabetes mellitus (HCC) 03/04/2015  . HLD (hyperlipidemia) 03/04/2015  . Acute blood loss anemia 03/04/2015  . GI bleeding 03/04/2015  . Lower GI bleed 03/04/2015  . GI bleed 03/04/2015  . History of colonic polyps   . Diverticulosis of colon with hemorrhage   . Nausea alone 07/22/2012  . Loss of weight 07/22/2012  . Anemia 07/22/2012    Past Surgical History:  Procedure Laterality Date  . ABDOMINAL HYSTERECTOMY    . BACK SURGERY    . CARDIAC CATHETERIZATION  2004  . COLONOSCOPY  02/14/09   WUJ:[id number] colon polyp/small internal hemorrhoids, benign polyp  . COLONOSCOPY  02/14/2009   SLF:3-mm sessile sigmoid colon polyp/frequent ascending colon and diverticula/small internal hemorrhoids  . COLONOSCOPY N/A 03/04/2015   Procedure: COLONOSCOPY;  Surgeon: Corbin Ade, MD;  Location: [facility];  Service: Endoscopy;  Laterality: N/A;  . ESOPHAGOGASTRODUODENOSCOPY N/A 02/26/2013   Procedure: ESOPHAGOGASTRODUODENOSCOPY (EGD);  Surgeon: West Bali, MD;   Location: [facility];  Service: Endoscopy;  Laterality: N/A;  9:30  . NECK SURGERY       OB History    Gravida      Para      Term      Preterm      AB      Living  1     SAB      TAB      Ectopic      Multiple      Live Births              Family History  Problem Relation Age of Onset  . Diabetes Mellitus II Mother   . Lung cancer Father   . Diabetes Mellitus II Sister   . Diabetes Mellitus II Sister   . Colon cancer Neg Hx     Social History   Tobacco Use  . Smoking status: Former Smoker    Quit date: 03/24/2005    Years since quitting: 14.1  . Smokeless tobacco: Never Used  Substance Use Topics  . Alcohol use: No    Alcohol/week: 0.0 standard drinks  . Drug use: No    Home Medications Prior to Admission medications   Medication Sig Start Date End Date Taking? Authorizing Provider  albuterol (PROVENTIL HFA;VENTOLIN HFA) 108 (90 Base) MCG/ACT inhaler Inhale 2 puffs into the lungs every 4 (four) hours as needed for wheezing or shortness of breath. 04/03/18  Yes Samuel Jester,  DO  amLODipine (NORVASC) 2.5 MG tablet Take 2.5 mg by mouth daily. 01/20/17  Yes [provider]  aspirin EC 81 MG tablet Take 1 tablet (81 mg total) by mouth daily. Restart on 03/12/15 03/05/15  Yes Erick Blinks, MD  benzonatate (TESSALON) 100 MG capsule Take 1 capsule (100 mg total) by mouth 3 (three) times daily as needed for cough. 04/03/18  Yes Samuel Jester, DO  metFORMIN (GLUCOPHAGE) 1000 MG tablet Take 1,000 mg by mouth 2 (two) times daily with a meal.  02/04/15  Yes [provider]  nitroGLYCERIN (NITROSTAT) 0.4 MG SL tablet Place 1 tablet (0.4 mg total) under the tongue every 5 (five) minutes as needed for chest pain. 09/04/15  Yes Jodelle Gross, NP  pioglitazone (ACTOS) 45 MG tablet Take 45 mg by mouth daily.  02/17/14  Yes [provider]  quinapril (ACCUPRIL) 40 MG tablet Take 20 mg by mouth daily.  07/05/15  Yes [provider]  cyclobenzaprine (FLEXERIL) 5 MG tablet Take 1 tablet (5 mg total) by mouth 3 (three) times daily as needed for muscle spasms. 04/29/19   Milagros Loll, MD  naproxen (NAPROSYN) 500 MG tablet Take 1 tablet (500 mg total) by mouth 2 (two) times daily as needed. 04/29/19   Milagros Loll, MD    Allergies    Atorvastatin  Review of Systems   Review of Systems  Constitutional: Negative for chills and fever.  HENT: Negative for ear pain and sore throat.   Eyes: Negative for pain and visual disturbance.  Respiratory: Negative for cough and shortness of breath.   Cardiovascular: Negative for chest pain and palpitations.  Gastrointestinal: Negative for abdominal pain and vomiting.  Genitourinary: Negative for dysuria and hematuria.  Musculoskeletal: Positive for back pain. Negative for arthralgias.  Skin: Negative for color change and rash.  Neurological: Negative for seizures and syncope.  All other systems reviewed and are negative.   Physical Exam Updated Vital Signs BP (!) 157/74 (BP Location: Left Arm)   Pulse 90   Temp 98.7 F (37.1 C) (Oral)   Resp 18   Ht 5' (1.524 m)   Wt 65.8 kg   SpO2 100%   BMI 28.32 kg/m   Physical Exam Vitals and nursing note reviewed.  Constitutional:      General: She is not in acute distress.    Appearance: She is well-developed.  HENT:     Head: Normocephalic and atraumatic.  Eyes:     Conjunctiva/sclera: Conjunctivae normal.  Cardiovascular:     Rate and Rhythm: Normal rate and regular rhythm.     Heart sounds: No murmur.  Pulmonary:     Effort: Pulmonary effort is normal. No respiratory distress.     Breath sounds: Normal breath sounds.  Abdominal:     Palpations: Abdomen is soft.     Tenderness: There is no abdominal tenderness.  Musculoskeletal:     Cervical back: Neck supple.     Comments: Tenderness to palpation right lower back, right flank No tenderness to palpation midline T, L-spine  Skin:    General:  Skin is warm and dry.     Capillary Refill: Capillary refill takes less than 2 seconds.  Neurological:     General: No focal deficit present.     Mental Status: She is alert and oriented to person, place, and time.     Comments: 5 out of 5 strength in bilateral lower extremities, sensation to light touch intact in bilateral lower extremities  Psychiatric:        Mood and Affect: Mood normal.        Behavior: Behavior normal.     ED Results / Procedures / Treatments   Labs (all labs ordered are listed, but only abnormal results are displayed) Labs Reviewed  CBC WITH DIFFERENTIAL/PLATELET - Abnormal; Notable for the following components:      Result Value   RBC 3.83 (*)    Hemoglobin 11.9 (*)    MCV 101.0 (*)    All other components within normal limits  COMPREHENSIVE METABOLIC PANEL - Abnormal; Notable for the following components:   Glucose, Bld 122 (*)    All other components within normal limits  URINALYSIS, ROUTINE W REFLEX MICROSCOPIC - Abnormal; Notable for the following components:   Color, Urine STRAW (*)    All other components within normal limits  LIPASE, BLOOD    EKG None  Radiology CT ABDOMEN PELVIS W CONTRAST  Addendum Date: 04/29/2019   ADDENDUM REPORT: 04/29/2019 21:06 ADDENDUM: In the body of the report as well as the impression it should state that there is a 3-4 mm anterolisthesis of L4 on L5, not C4 on C5. Additionally it should state that there is a posterior disc osteophyte complex at the L4-L5 level, not the C4-C5 level. Electronically Signed   By: Constance Holster M.D.   On: 04/29/2019 21:06   Result Date: 04/29/2019 CLINICAL DATA:  Flank pain. Right-sided flank pain. EXAM: CT ABDOMEN AND PELVIS WITH CONTRAST TECHNIQUE: Multidetector CT imaging of the abdomen and pelvis was performed using the standard protocol following bolus administration of intravenous contrast. CONTRAST:  170mL OMNIPAQUE IOHEXOL 300 MG/ML  SOLN COMPARISON:  March 04, 2015  FINDINGS: Lower chest: The lung bases are clear. The heart size is normal. Hepatobiliary: The liver is normal. Normal gallbladder.There is no biliary ductal dilation. Pancreas: Normal contours without ductal dilatation. No peripancreatic fluid collection. Spleen: No splenic laceration or hematoma. Adrenals/Urinary Tract: --Adrenal glands: No adrenal hemorrhage. --Right kidney/ureter: Multiple simple cysts are noted without evidence for hydronephrosis or radiopaque obstructing kidney stone. --Left kidney/ureter: Multiple cysts are noted without evidence for hydronephrosis or radiopaque obstructing kidney stone. --Urinary bladder: Unremarkable. Stomach/Bowel: --Stomach/Duodenum: No hiatal hernia or other gastric abnormality. Normal duodenal course and caliber. --Small bowel: No dilatation or inflammation. --Colon: There is a large amount of stool throughout the visualized colon. There are few scattered colonic diverticula without CT evidence for diverticulitis. --Appendix: Normal. Vascular/Lymphatic: Atherosclerotic calcification is present within the non-aneurysmal abdominal aorta, without hemodynamically significant stenosis. --No retroperitoneal lymphadenopathy. --No mesenteric lymphadenopathy. --No pelvic or inguinal lymphadenopathy. Reproductive: Status post hysterectomy. No adnexal mass. Other: No ascites or free air. The abdominal wall is normal. Musculoskeletal. There are degenerative changes throughout the lumbar spine. There is a 3-4 mm anterolisthesis C4 on C5. There is a prominent posterior disc osteophyte complex at the C4-C5 level likely resulting in spinal canal stenosis and bilateral neural foraminal narrowing. There is no acute displaced fracture. There is a probable sebaceous cyst in the posterior back at the level of the T12 vertebral body. IMPRESSION: 1. No CT evidence of acute intra-abdominal or pelvic pathology. 2. Normal appendix in the right lower quadrant. No radiopaque obstructing kidney  stones. 3. There is a large amount of stool throughout the visualized colon. 4. There is a 3-4 mm anterolisthesis C4 on C5, likely degenerative in etiology. There is a prominent posterior disc osteophyte complex at the C4-C5 level likely resulting in spinal canal stenosis and bilateral neural foraminal  narrowing. Aortic Atherosclerosis (ICD10-I70.0). Electronically Signed: By: Katherine Mantle M.D. On: 04/29/2019 20:28    Procedures Procedures (including critical care time)  Medications Ordered in ED Medications  fentaNYL (SUBLIMAZE) injection 50 mcg (50 mcg Intravenous Given 04/29/19 2045)  iohexol (OMNIPAQUE) 300 MG/ML solution 100 mL (100 mLs Intravenous Contrast Given 04/29/19 2008)    ED Course  I have reviewed the triage vital signs and the nursing notes.  Pertinent labs & imaging results that were available during my care of the patient were reviewed by me and considered in my medical decision making (see chart for details).    MDM Rules/Calculators/A&P                      74 year old lady presenting to ER with new onset low back pain, worse on right side.  Labs grossly within normal limits.  CT scan negative for acute process, did demonstrate some anterolisthesis L4-L5.  Seen on prior CT scan.  No neurologic deficits, no red flag symptoms for back pain.  Urine negative.  Given description of symptoms, work-up today, suspect most likely MSK strain.  Recommend trial muscle relaxer, NSAIDs, follow-up with PCP.    After the discussed management above, the patient was determined to be safe for discharge.  The patient was in agreement with this plan and all questions regarding their care were answered.  ED return precautions were discussed and the patient will return to the ED with any significant worsening of condition.   Final Clinical Impression(s) / ED Diagnoses Final diagnoses:  Strain of lumbar region, initial encounter    Rx / DC Orders ED Discharge Orders         Ordered     naproxen (NAPROSYN) 500 MG tablet  2 times daily PRN     04/29/19 2125    cyclobenzaprine (FLEXERIL) 5 MG tablet  3 times daily PRN     04/29/19 2125           Milagros Loll, MD 04/29/19 2326

## 2019-04-29 NOTE — ED Triage Notes (Signed)
PT c/o middle/lower back pain x7 days with no known injury. PT states she did a tele visit with her primary and gave a urine sample on 04/26/2019 but it still hasn't resulted. PT denies any urinary symptoms and states it feels worse when she's walking.

## 2019-05-11 ENCOUNTER — Other Ambulatory Visit (HOSPITAL_COMMUNITY): Payer: Self-pay | Admitting: Internal Medicine

## 2019-05-11 DIAGNOSIS — Z1231 Encounter for screening mammogram for malignant neoplasm of breast: Secondary | ICD-10-CM

## 2019-06-03 ENCOUNTER — Other Ambulatory Visit: Payer: Self-pay

## 2019-06-03 ENCOUNTER — Ambulatory Visit (HOSPITAL_COMMUNITY)
Admission: RE | Admit: 2019-06-03 | Discharge: 2019-06-03 | Disposition: A | Discharge status: Home / Self Care | Payer: Medicare HMO | Source: Ambulatory Visit | Attending: Internal Medicine | Admitting: Internal Medicine

## 2019-06-03 DIAGNOSIS — Z1231 Encounter for screening mammogram for malignant neoplasm of breast: Secondary | ICD-10-CM | POA: Diagnosis not present

## 2019-06-04 ENCOUNTER — Other Ambulatory Visit (HOSPITAL_COMMUNITY): Payer: Self-pay | Admitting: Internal Medicine

## 2019-06-07 ENCOUNTER — Other Ambulatory Visit (HOSPITAL_COMMUNITY): Payer: Self-pay | Admitting: Internal Medicine

## 2019-06-07 DIAGNOSIS — R928 Other abnormal and inconclusive findings on diagnostic imaging of breast: Secondary | ICD-10-CM

## 2019-06-22 ENCOUNTER — Other Ambulatory Visit: Payer: Self-pay

## 2019-06-22 ENCOUNTER — Ambulatory Visit (HOSPITAL_COMMUNITY)
Admission: RE | Admit: 2019-06-22 | Discharge: 2019-06-22 | Disposition: A | Discharge status: Home / Self Care | Payer: Medicare HMO | Source: Ambulatory Visit | Attending: Internal Medicine | Admitting: Internal Medicine

## 2019-06-22 ENCOUNTER — Other Ambulatory Visit (HOSPITAL_COMMUNITY): Payer: Medicare HMO

## 2019-06-22 DIAGNOSIS — R928 Other abnormal and inconclusive findings on diagnostic imaging of breast: Secondary | ICD-10-CM

## 2019-12-01 ENCOUNTER — Encounter: Payer: Self-pay | Admitting: Emergency Medicine

## 2019-12-01 ENCOUNTER — Ambulatory Visit
Admission: EM | Admit: 2019-12-01 | Discharge: 2019-12-01 | Disposition: A | Discharge status: Home / Self Care | Payer: Medicare HMO | Attending: Emergency Medicine | Admitting: Emergency Medicine

## 2019-12-01 ENCOUNTER — Other Ambulatory Visit: Payer: Self-pay

## 2019-12-01 DIAGNOSIS — Z20822 Contact with and (suspected) exposure to covid-19: Secondary | ICD-10-CM

## 2019-12-01 DIAGNOSIS — J069 Acute upper respiratory infection, unspecified: Secondary | ICD-10-CM

## 2019-12-01 MED ORDER — DEXAMETHASONE SODIUM PHOSPHATE 10 MG/ML IJ SOLN
10.0000 mg | Freq: Once | INTRAMUSCULAR | Status: AC
  Administered 2019-12-01: 10 mg via INTRAMUSCULAR

## 2019-12-01 MED ORDER — BENZONATATE 100 MG PO CAPS: 100.0000 mg | ORAL_CAPSULE | Freq: Three times a day (TID) | ORAL | 0 refills | Status: DC

## 2019-12-01 NOTE — ED Triage Notes (Signed)
Coughing and soreness in chest when coughing since Monday

## 2019-12-01 NOTE — ED Provider Notes (Signed)
[facility]   [id number] 12/01/19 Arrival Time: 1033   CC: Cough  SUBJECTIVE: History from: patient.  [name] is a 74 y.o. female who presents with productive cough with white sputum x 2 days.  Denies sick exposure to COVID, flu or strep.  Denies alleviating or aggravating factors.  Reports previous symptoms in the past with bronchitis.   Complains of associated mild sore throat.  Denies fever, chills, fatigue, sinus pain, rhinorrhea, SOB, wheezing, chest pain, nausea, changes in bowel or bladder habits.     ROS: As per HPI.  All other pertinent ROS negative.     Past Medical History:  Diagnosis Date  . Diabetes (HCC)   . High cholesterol   . Hypertension   . Myocardial infarction (HCC) 2004.   Past Surgical History:  Procedure Laterality Date  . ABDOMINAL HYSTERECTOMY    . BACK SURGERY    . CARDIAC CATHETERIZATION  2004  . COLONOSCOPY  02/14/09   [id number] colon polyp/small internal hemorrhoids, benign polyp  . COLONOSCOPY  02/14/2009   SLF:3-mm sessile sigmoid colon polyp/frequent ascending colon and diverticula/small internal hemorrhoids  . COLONOSCOPY N/A 03/04/2015   Procedure: COLONOSCOPY;  Surgeon: Corbin Ade, MD;  Location: [facility];  Service: Endoscopy;  Laterality: N/A;  . ESOPHAGOGASTRODUODENOSCOPY N/A 02/26/2013   Procedure: ESOPHAGOGASTRODUODENOSCOPY (EGD);  Surgeon: West Bali, MD;  Location: [facility];  Service: Endoscopy;  Laterality: N/A;  9:30  . NECK SURGERY     Allergies  Allergen Reactions  . Atorvastatin Other (See Comments)    Muscle cramps.   No current facility-administered medications on file prior to encounter.   Current Outpatient Medications on File Prior to Encounter  Medication Sig Dispense Refill  . albuterol (PROVENTIL HFA;VENTOLIN HFA) 108 (90 Base) MCG/ACT inhaler Inhale 2 puffs into the lungs every 4 (four) hours as needed for wheezing or shortness of breath. 1 Inhaler 0  . amLODipine  (NORVASC) 2.5 MG tablet Take 2.5 mg by mouth daily.    Marland Kitchen aspirin EC 81 MG tablet Take 1 tablet (81 mg total) by mouth daily. Restart on 03/12/15    . metFORMIN (GLUCOPHAGE) 1000 MG tablet Take 1,000 mg by mouth 2 (two) times daily with a meal.     . naproxen (NAPROSYN) 500 MG tablet Take 1 tablet (500 mg total) by mouth 2 (two) times daily as needed. 30 tablet 0  . nitroGLYCERIN (NITROSTAT) 0.4 MG SL tablet Place 1 tablet (0.4 mg total) under the tongue every 5 (five) minutes as needed for chest pain. 25 tablet 3  . pioglitazone (ACTOS) 45 MG tablet Take 45 mg by mouth daily.     . quinapril (ACCUPRIL) 40 MG tablet Take 20 mg by mouth daily.      Social History   Socioeconomic History  . Marital status: Single    Spouse name: Not on file  . Number of children: Not on file  . Years of education: Not on file  . Highest education level: Not on file  Occupational History  . Not on file  Tobacco Use  . Smoking status: Former Smoker    Quit date: 03/24/2005    Years since quitting: 14.6  . Smokeless tobacco: Never Used  Vaping Use  . Vaping Use: Never used  Substance and Sexual Activity  . Alcohol use: No    Alcohol/week: 0.0 standard drinks  . Drug use: No  . Sexual activity: Not on file  Other Topics Concern  . Not on  file  Social History Narrative  . Not on file   Social Determinants of Health   Financial Resource Strain:   . Difficulty of Paying Living Expenses: Not on file  Food Insecurity:   . Worried About Programme researcher, broadcasting/film/video in the Last Year: Not on file  . Ran Out of Food in the Last Year: Not on file  Transportation Needs:   . Lack of Transportation (Medical): Not on file  . Lack of Transportation (Non-Medical): Not on file  Physical Activity:   . Days of Exercise per Week: Not on file  . Minutes of Exercise per Session: Not on file  Stress:   . Feeling of Stress : Not on file  Social Connections:   . Frequency of Communication with Friends and Family: Not on file    . Frequency of Social Gatherings with Friends and Family: Not on file  . Attends Religious Services: Not on file  . Active Member of Clubs or Organizations: Not on file  . Attends Banker Meetings: Not on file  . Marital Status: Not on file  Intimate Partner Violence:   . Fear of Current or Ex-Partner: Not on file  . Emotionally Abused: Not on file  . Physically Abused: Not on file  . Sexually Abused: Not on file   Family History  Problem Relation Age of Onset  . Diabetes Mellitus II Mother   . Lung cancer Father   . Diabetes Mellitus II Sister   . Diabetes Mellitus II Sister   . Colon cancer Neg Hx     OBJECTIVE:  Vitals:   12/01/19 1042  BP: (!) 162/83  Pulse: 96  Resp: 19  Temp: 99 F (37.2 C)  TempSrc: Oral  SpO2: 95%  Weight: 140 lb (63.5 kg)  Height: 5' (1.524 m)     General appearance: alert; appears mildly fatigued, but nontoxic; speaking in full sentences and tolerating own secretions HEENT: NCAT; Ears: EACs clear, TMs pearly gray; Eyes: PERRL.  EOM grossly intact. Nose: nares patent without rhinorrhea, Throat: oropharynx clear, tonsils non erythematous or enlarged, uvula midline  Neck: supple without LAD Lungs: unlabored respirations, symmetrical air entry; cough: mild; no respiratory distress; subtle wheezes over anterior chest Heart: regular rate and rhythm.   Skin: warm and dry Psychological: alert and cooperative; normal mood and affect  ASSESSMENT & PLAN:  1. Viral URI with cough   2. Suspected COVID-19 virus infection     Meds ordered this encounter  Medications  . benzonatate (TESSALON) 100 MG capsule    Sig: Take 1 capsule (100 mg total) by mouth every 8 (eight) hours.    Dispense:  21 capsule    Refill:  0    Order Specific Question:   Supervising Provider    Answer:   Eustace Moore [[id number]]  . dexamethasone (DECADRON) injection 10 mg   COVID testing ordered.  It will take between 5-7 days for test results.  Someone  will contact you regarding abnormal results.    In the meantime: You should remain isolated in your home for 10 days from symptom onset AND greater than 72 hours after symptoms resolution (absence of fever without the use of fever-reducing medication and improvement in respiratory symptoms), whichever is longer Get plenty of rest and push fluids Tessalon Perles prescribed for cough Use OTC zyrtec for nasal congestion, runny nose, and/or sore throat Use OTC flonase for nasal congestion and runny nose Use medications daily for symptom relief Use OTC medications  like ibuprofen or tylenol as needed fever or pain Call or go to the ED if you have any new or worsening symptoms such as fever, worsening cough, shortness of breath, chest tightness, chest pain, turning blue, changes in mental status, etc...   Decadron shot given in office  Reviewed expectations re: course of current medical issues. Questions answered. Outlined signs and symptoms indicating need for more acute intervention. Patient verbalized understanding. After Visit Summary given.         Rennis Harding, PA-C 12/01/19 1104

## 2019-12-01 NOTE — Discharge Instructions (Signed)

## 2019-12-03 ENCOUNTER — Telehealth: Payer: Self-pay | Admitting: Emergency Medicine

## 2019-12-03 LAB — SARS-COV-2, NAA 2 DAY TAT

## 2019-12-03 LAB — NOVEL CORONAVIRUS, NAA: SARS-CoV-2, NAA: NOT DETECTED

## 2019-12-03 MED ORDER — HYDROCODONE-HOMATROPINE 5-1.5 MG/5ML PO SYRP: 5.0000 mL | ORAL_SOLUTION | Freq: Four times a day (QID) | ORAL | 0 refills | Status: AC | PRN

## 2019-12-03 NOTE — Telephone Encounter (Addendum)
Patient Symptom has not been improving since last visit. Hydromet was added. She was advised to take Tessalon for moderate  cough symptom and Hydromet for severe or uncontrolled cough. PDMP has been reviewed prior sending medications

## 2020-06-30 ENCOUNTER — Other Ambulatory Visit (HOSPITAL_COMMUNITY): Payer: Self-pay | Admitting: Internal Medicine

## 2020-06-30 DIAGNOSIS — Z1231 Encounter for screening mammogram for malignant neoplasm of breast: Secondary | ICD-10-CM

## 2020-07-05 ENCOUNTER — Ambulatory Visit (HOSPITAL_COMMUNITY)
Admission: RE | Admit: 2020-07-05 | Discharge: 2020-07-05 | Disposition: A | Discharge status: Home / Self Care | Payer: Medicare HMO | Source: Ambulatory Visit | Attending: Internal Medicine | Admitting: Internal Medicine

## 2020-07-05 DIAGNOSIS — Z1231 Encounter for screening mammogram for malignant neoplasm of breast: Secondary | ICD-10-CM | POA: Insufficient documentation

## 2020-07-06 ENCOUNTER — Other Ambulatory Visit (HOSPITAL_COMMUNITY): Payer: Self-pay | Admitting: Internal Medicine

## 2020-07-06 DIAGNOSIS — R928 Other abnormal and inconclusive findings on diagnostic imaging of breast: Secondary | ICD-10-CM

## 2020-07-13 ENCOUNTER — Other Ambulatory Visit: Payer: Self-pay

## 2020-07-13 ENCOUNTER — Ambulatory Visit (HOSPITAL_COMMUNITY)
Admission: RE | Admit: 2020-07-13 | Discharge: 2020-07-13 | Disposition: A | Discharge status: Home / Self Care | Payer: Medicare HMO | Source: Ambulatory Visit | Attending: Internal Medicine | Admitting: Internal Medicine

## 2020-07-13 DIAGNOSIS — R928 Other abnormal and inconclusive findings on diagnostic imaging of breast: Secondary | ICD-10-CM

## 2020-08-18 ENCOUNTER — Ambulatory Visit
Admission: EM | Admit: 2020-08-18 | Discharge: 2020-08-18 | Disposition: A | Discharge status: Home / Self Care | Payer: Medicare HMO | Attending: Emergency Medicine | Admitting: Emergency Medicine

## 2020-08-18 ENCOUNTER — Encounter: Payer: Self-pay | Admitting: Emergency Medicine

## 2020-08-18 ENCOUNTER — Other Ambulatory Visit: Payer: Self-pay

## 2020-08-18 ENCOUNTER — Ambulatory Visit (INDEPENDENT_AMBULATORY_CARE_PROVIDER_SITE_OTHER): Payer: Medicare HMO

## 2020-08-18 DIAGNOSIS — R059 Cough, unspecified: Secondary | ICD-10-CM | POA: Diagnosis not present

## 2020-08-18 DIAGNOSIS — J209 Acute bronchitis, unspecified: Secondary | ICD-10-CM

## 2020-08-18 MED ORDER — DEXAMETHASONE SODIUM PHOSPHATE 10 MG/ML IJ SOLN
10.0000 mg | Freq: Once | INTRAMUSCULAR | Status: AC
  Administered 2020-08-18: 10 mg via INTRAMUSCULAR

## 2020-08-18 MED ORDER — PREDNISONE 10 MG (21) PO TBPK: ORAL_TABLET | Freq: Every day | ORAL | 0 refills | Status: DC

## 2020-08-18 MED ORDER — AZITHROMYCIN 250 MG PO TABS: 250.0000 mg | ORAL_TABLET | Freq: Every day | ORAL | 0 refills | Status: DC

## 2020-08-18 MED ORDER — BENZONATATE 100 MG PO CAPS: 100.0000 mg | ORAL_CAPSULE | Freq: Three times a day (TID) | ORAL | 0 refills | Status: DC

## 2020-08-18 NOTE — ED Triage Notes (Signed)
Cough, sneezing and congestion x 2 weeks.  Has had otc meds with no relief.

## 2020-08-18 NOTE — ED Provider Notes (Signed)
[facility]   [id number] 08/18/20 Arrival Time: 1556  Cc: COUGH  SUBJECTIVE:  [name] is a 76 y.o. female who presents with productive cough with green phlegm x 2 weeks.  Denies positive sick exposure or precipitating event.  Describes cough as intermittent and productive.  Denies alleviating or aggravating factors.  Denies fever, chills, fatigue, sinus pain, rhinorrhea, sore throat, SOB, wheezing, chest pain, nausea, changes in bowel or bladder habits.    ROS: As per HPI.  All other pertinent ROS negative.     Past Medical History:  Diagnosis Date   Diabetes (HCC)    High cholesterol    Hypertension    Myocardial infarction (HCC) 2004.   Past Surgical History:  Procedure Laterality Date   ABDOMINAL HYSTERECTOMY     BACK SURGERY     CARDIAC CATHETERIZATION  2004   COLONOSCOPY  02/14/09   [id number] colon polyp/small internal hemorrhoids, benign polyp   COLONOSCOPY  02/14/2009   SLF:3-mm sessile sigmoid colon polyp/frequent ascending colon and diverticula/small internal hemorrhoids   COLONOSCOPY N/A 03/04/2015   Procedure: COLONOSCOPY;  Surgeon: Corbin Ade, MD;  [facility]: [facility];  Service: Endoscopy;  Laterality: N/A;   ESOPHAGOGASTRODUODENOSCOPY N/A 02/26/2013   Procedure: ESOPHAGOGASTRODUODENOSCOPY (EGD);  Surgeon: West Bali, MD;  Location: [facility];  Service: Endoscopy;  Laterality: N/A;  9:30   NECK SURGERY     Allergies  Allergen Reactions   Atorvastatin Other (See Comments)    Muscle cramps.   No current facility-administered medications on file prior to encounter.   Current Outpatient Medications on File Prior to Encounter  Medication Sig Dispense Refill   albuterol (PROVENTIL HFA;VENTOLIN HFA) 108 (90 Base) MCG/ACT inhaler Inhale 2 puffs into the lungs every 4 (four) hours as needed for wheezing or shortness of breath. 1 Inhaler 0   amLODipine (NORVASC) 2.5 MG tablet Take 2.5 mg by mouth daily.     aspirin EC 81 MG  tablet Take 1 tablet (81 mg total) by mouth daily. Restart on 03/12/15     metFORMIN (GLUCOPHAGE) 1000 MG tablet Take 1,000 mg by mouth 2 (two) times daily with a meal.      naproxen (NAPROSYN) 500 MG tablet Take 1 tablet (500 mg total) by mouth 2 (two) times daily as needed. 30 tablet 0   nitroGLYCERIN (NITROSTAT) 0.4 MG SL tablet Place 1 tablet (0.4 mg total) under the tongue every 5 (five) minutes as needed for chest pain. 25 tablet 3   pioglitazone (ACTOS) 45 MG tablet Take 45 mg by mouth daily.      quinapril (ACCUPRIL) 40 MG tablet Take 20 mg by mouth daily.       Social History   Socioeconomic History   Marital status: Widowed    Spouse name: Not on file   Number of children: Not on file   Years of education: Not on file   Highest education level: Not on file  Occupational History   Not on file  Tobacco Use   Smoking status: Former    Pack years: 0.00    Types: Cigarettes    Quit date: 03/24/2005    Years since quitting: 15.4   Smokeless tobacco: Never  Vaping Use   Vaping Use: Never used  Substance and Sexual Activity   Alcohol use: No    Alcohol/week: 0.0 standard drinks   Drug use: No   Sexual activity: Not on file  Other Topics Concern   Not on file  Social History  Narrative   Not on file   Social Determinants of Health   Financial Resource Strain: Not on file  Food Insecurity: Not on file  Transportation Needs: Not on file  Physical Activity: Not on file  Stress: Not on file  Social Connections: Not on file  Intimate Partner Violence: Not on file   Family History  Problem Relation Age of Onset   Diabetes Mellitus II Mother    Lung cancer Father    Diabetes Mellitus II Sister    Diabetes Mellitus II Sister    Colon cancer Neg Hx      OBJECTIVE:  Vitals:   08/18/20 1624  BP: (!) 174/83  Pulse: 87  Resp: 16  Temp: 97.8 F (36.6 C)  TempSrc: Tympanic  SpO2: 97%     General appearance: Alert, appears fatigued, but nontoxic; speaking in full  sentences without difficulty HEENT:NCAT; Ears: EACs clear, TMs pearly gray; Eyes: PERRL.  EOM grossly intact. Nose: nares patent without rhinorrhea; Throat: tonsils nonerythematous or enlarged, uvula midline  Neck: supple without LAD Lungs: subtle expiratory wheezes on LT side; normal respiratory effort; mild cough present Heart: regular rate and rhythm.   Skin: warm and dry Psychological: alert and cooperative; normal mood and affect  DIAGNOSTIC STUDIES:  DG Chest 2 View  Result Date: 08/18/2020 CLINICAL DATA:  Cough and congestion for 2 weeks, initial encounter EXAM: CHEST - 2 VIEW COMPARISON:  04/03/2018 FINDINGS: Cardiac shadow is within normal limits. Aortic calcifications are again noted and stable. Lungs are well aerated bilaterally. No focal infiltrate or sizable effusion is seen. Minimal left basilar scarring is noted. Mild degenerative changes of the thoracic spine are seen. IMPRESSION: No active cardiopulmonary disease. Electronically Signed   By: Alcide Clever M.D.   On: 08/18/2020 17:19     ASSESSMENT & PLAN:  1. Cough   2. Acute bronchitis, unspecified organism     Meds ordered this encounter  Medications   predniSONE (STERAPRED UNI-PAK 21 TAB) 10 MG (21) TBPK tablet    Sig: Take by mouth daily. Take 6 tabs by mouth daily  for 2 days, then 5 tabs for 2 days, then 4 tabs for 2 days, then 3 tabs for 2 days, 2 tabs for 2 days, then 1 tab by mouth daily for 2 days    Dispense:  42 tablet    Refill:  0    Order Specific Question:   Supervising Provider    Answer:   Eustace Moore [[id number]]   benzonatate (TESSALON) 100 MG capsule    Sig: Take 1 capsule (100 mg total) by mouth every 8 (eight) hours.    Dispense:  21 capsule    Refill:  0    Order Specific Question:   Supervising Provider    Answer:   Eustace Moore [[id number]]   azithromycin (ZITHROMAX) 250 MG tablet    Sig: Take 1 tablet (250 mg total) by mouth daily. Take first 2 tablets together, then 1 every day  until finished.    Dispense:  6 tablet    Refill:  0    Order Specific Question:   Supervising Provider    Answer:   Eustace Moore [[id number]]   dexamethasone (DECADRON) injection 10 mg     Orders Placed This Encounter  Procedures   DG Chest 2 View    Standing Status:   Standing    Number of Occurrences:   1    Order Specific Question:   Reason for Exam (SYMPTOM  OR DIAGNOSIS REQUIRED)    Answer:   wheezing LT side    Based on exam and history of symptoms will cover for bronchitis Steroid shot given in office Prednisone prescribed.  Take as directed and to completion Z-pak prescribed.  Take as directed and to completion Get plenty of rest and push fluids Prescribed tessolone perles as needed for cough Use OTC medication as needed for symptomatic relief Follow up with PCP for recheck and/or if symptoms persists Return or go to ER if you have any new or worsening symptoms such as fever, chills, fatigue, shortness of breath, wheezing, chest pain, nausea, changes in bowel or bladder habits, etc...  Reviewed expectations re: course of current medical issues. Questions answered. Outlined signs and symptoms indicating need for more acute intervention. Patient verbalized understanding. After Visit Summary given.           Rennis Harding, PA-C 08/18/20 1728

## 2020-08-18 NOTE — Discharge Instructions (Addendum)
Based on exam and history of symptoms will cover for bronchitis Steroid shot given in office Prednisone prescribed.  Take as directed and to completion Z-pak prescribed.  Take as directed and to completion Get plenty of rest and push fluids Prescribed tessolone perles as needed for cough Use OTC medication as needed for symptomatic relief Follow up with PCP for recheck and/or if symptoms persists Return or go to ER if you have any new or worsening symptoms such as fever, chills, fatigue, shortness of breath, wheezing, chest pain, nausea, changes in bowel or bladder habits, etc..Marland Kitchen

## 2020-09-01 ENCOUNTER — Ambulatory Visit
Admission: EM | Admit: 2020-09-01 | Discharge: 2020-09-01 | Disposition: A | Discharge status: Home / Self Care | Payer: Medicare HMO | Attending: Emergency Medicine | Admitting: Emergency Medicine

## 2020-09-01 ENCOUNTER — Encounter: Payer: Self-pay | Admitting: Emergency Medicine

## 2020-09-01 DIAGNOSIS — R059 Cough, unspecified: Secondary | ICD-10-CM | POA: Diagnosis not present

## 2020-09-01 DIAGNOSIS — R062 Wheezing: Secondary | ICD-10-CM

## 2020-09-01 MED ORDER — DEXAMETHASONE SODIUM PHOSPHATE 10 MG/ML IJ SOLN
10.0000 mg | Freq: Once | INTRAMUSCULAR | Status: AC
  Administered 2020-09-01: 10 mg via INTRAMUSCULAR

## 2020-09-01 NOTE — Discharge Instructions (Signed)
Get plenty of rest and push fluids Steroid shot given in office Follow up with PCP for recheck and/or if symptoms persists Go to ER if you have any new or worsening symptoms such as fever, chills, fatigue, shortness of breath, wheezing, chest pain, nausea, changes in bowel or bladder habits, etc..Marland Kitchen

## 2020-09-01 NOTE — ED Triage Notes (Signed)
Pt seen here on 08/18/20 and has finished all of the meds she was given.  States she still has a cough, fatigue and feels like she has phlegm in her throat.  States sometimes she can cough it up but sometimes it seems stuck.

## 2020-09-01 NOTE — ED Provider Notes (Signed)
[facility]   [id number] 09/01/20 Arrival Time: 1347  Cc: COUGH  SUBJECTIVE:  [name] is a 75 y.o. female who presents with persistent cough x 2 weeks.  Productive.  States she feels like the phlegm is getting caught in her throat.  Also reports fatigued.  Reports mild improve with antibiotic, and steroid.  Would like to try a steroid shot today.  Denies fever, chills, fatigue, SOB, chest pain, nausea, changes in bowel or bladder habits.    ROS: As per HPI.  All other pertinent ROS negative.     Past Medical History:  Diagnosis Date   Diabetes (HCC)    High cholesterol    Hypertension    Myocardial infarction (HCC) 2004.   Past Surgical History:  Procedure Laterality Date   ABDOMINAL HYSTERECTOMY     BACK SURGERY     CARDIAC CATHETERIZATION  2004   COLONOSCOPY  02/14/09   [id number] colon polyp/small internal hemorrhoids, benign polyp   COLONOSCOPY  02/14/2009   SLF:3-mm sessile sigmoid colon polyp/frequent ascending colon and diverticula/small internal hemorrhoids   COLONOSCOPY N/A 03/04/2015   Procedure: COLONOSCOPY;  Surgeon: Corbin Ade, MD;  Location: [facility];  Service: Endoscopy;  Laterality: N/A;   ESOPHAGOGASTRODUODENOSCOPY N/A 02/26/2013   Procedure: ESOPHAGOGASTRODUODENOSCOPY (EGD);  Surgeon: West Bali, MD;  Location: [facility];  Service: Endoscopy;  Laterality: N/A;  9:30   NECK SURGERY     Allergies  Allergen Reactions   Atorvastatin Other (See Comments)    Muscle cramps.   No current facility-administered medications on file prior to encounter.   Current Outpatient Medications on File Prior to Encounter  Medication Sig Dispense Refill   albuterol (PROVENTIL HFA;VENTOLIN HFA) 108 (90 Base) MCG/ACT inhaler Inhale 2 puffs into the lungs every 4 (four) hours as needed for wheezing or shortness of breath. 1 Inhaler 0   amLODipine (NORVASC) 2.5 MG tablet Take 2.5 mg by mouth daily.     aspirin EC 81 MG tablet Take 1 tablet  (81 mg total) by mouth daily. Restart on 03/12/15     azithromycin (ZITHROMAX) 250 MG tablet Take 1 tablet (250 mg total) by mouth daily. Take first 2 tablets together, then 1 every day until finished. 6 tablet 0   benzonatate (TESSALON) 100 MG capsule Take 1 capsule (100 mg total) by mouth every 8 (eight) hours. 21 capsule 0   metFORMIN (GLUCOPHAGE) 1000 MG tablet Take 1,000 mg by mouth 2 (two) times daily with a meal.      naproxen (NAPROSYN) 500 MG tablet Take 1 tablet (500 mg total) by mouth 2 (two) times daily as needed. 30 tablet 0   nitroGLYCERIN (NITROSTAT) 0.4 MG SL tablet Place 1 tablet (0.4 mg total) under the tongue every 5 (five) minutes as needed for chest pain. 25 tablet 3   pioglitazone (ACTOS) 45 MG tablet Take 45 mg by mouth daily.      predniSONE (STERAPRED UNI-PAK 21 TAB) 10 MG (21) TBPK tablet Take by mouth daily. Take 6 tabs by mouth daily  for 2 days, then 5 tabs for 2 days, then 4 tabs for 2 days, then 3 tabs for 2 days, 2 tabs for 2 days, then 1 tab by mouth daily for 2 days 42 tablet 0   quinapril (ACCUPRIL) 40 MG tablet Take 20 mg by mouth daily.       Social History   Socioeconomic History   Marital status: Widowed    Spouse name: Not on file  Number of children: Not on file   Years of education: Not on file   Highest education level: Not on file  Occupational History   Not on file  Tobacco Use   Smoking status: Former    Pack years: 0.00    Types: Cigarettes    Quit date: 03/24/2005    Years since quitting: 15.4   Smokeless tobacco: Never  Vaping Use   Vaping Use: Never used  Substance and Sexual Activity   Alcohol use: No    Alcohol/week: 0.0 standard drinks   Drug use: No   Sexual activity: Not on file  Other Topics Concern   Not on file  Social History Narrative   Not on file   Social Determinants of Health   Financial Resource Strain: Not on file  Food Insecurity: Not on file  Transportation Needs: Not on file  Physical Activity: Not on file   Stress: Not on file  Social Connections: Not on file  Intimate Partner Violence: Not on file   Family History  Problem Relation Age of Onset   Diabetes Mellitus II Mother    Lung cancer Father    Diabetes Mellitus II Sister    Diabetes Mellitus II Sister    Colon cancer Neg Hx      OBJECTIVE:  Vitals:   09/01/20 1501  BP: (!) 146/75  Pulse: 92  Resp: 18  Temp: 98.7 F (37.1 C)  TempSrc: Oral  SpO2: 98%     General appearance: Alert, appears mildly fatigued, but nontoxic; speaking in full sentences without difficulty HEENT:NCAT; Ears: EACs clear, TMs pearly gray; Eyes: PERRL.  EOM grossly intact. Nose: nares patent without rhinorrhea; Throat: tonsils nonerythematous or enlarged, uvula midline  Neck: supple without LAD Lungs: subtle expiratory wheezes appreciated; normal respiratory effort; mild cough present Heart: regular rate and rhythm.   Skin: warm and dry Psychological: alert and cooperative; normal mood and affect   ASSESSMENT & PLAN:  1. Cough   2. Wheezing     Meds ordered this encounter  Medications   dexamethasone (DECADRON) injection 10 mg    No orders of the defined types were placed in this encounter.    Get plenty of rest and push fluids Steroid shot given in office Follow up with PCP for recheck and/or if symptoms persists Go to ER if you have any new or worsening symptoms such as fever, chills, fatigue, shortness of breath, wheezing, chest pain, nausea, changes in bowel or bladder habits, etc...  Reviewed expectations re: course of current medical issues. Questions answered. Outlined signs and symptoms indicating need for more acute intervention. Patient verbalized understanding. After Visit Summary given.           Rennis Harding, PA-C 09/01/20 1512

## 2021-07-24 ENCOUNTER — Other Ambulatory Visit (HOSPITAL_COMMUNITY): Payer: Self-pay | Admitting: Internal Medicine

## 2021-07-24 DIAGNOSIS — Z1231 Encounter for screening mammogram for malignant neoplasm of breast: Secondary | ICD-10-CM

## 2021-08-01 ENCOUNTER — Ambulatory Visit (HOSPITAL_COMMUNITY)
Admission: RE | Admit: 2021-08-01 | Discharge: 2021-08-01 | Disposition: A | Discharge status: Home / Self Care | Payer: Medicare HMO | Source: Ambulatory Visit | Attending: Internal Medicine | Admitting: Internal Medicine

## 2021-08-01 DIAGNOSIS — Z1231 Encounter for screening mammogram for malignant neoplasm of breast: Secondary | ICD-10-CM | POA: Diagnosis present

## 2021-08-03 ENCOUNTER — Other Ambulatory Visit (HOSPITAL_COMMUNITY): Payer: Self-pay | Admitting: Internal Medicine

## 2021-08-07 ENCOUNTER — Other Ambulatory Visit (HOSPITAL_COMMUNITY): Payer: Self-pay | Admitting: Internal Medicine

## 2021-08-07 DIAGNOSIS — R928 Other abnormal and inconclusive findings on diagnostic imaging of breast: Secondary | ICD-10-CM

## 2021-08-14 ENCOUNTER — Ambulatory Visit (HOSPITAL_COMMUNITY)
Admission: RE | Admit: 2021-08-14 | Discharge: 2021-08-14 | Disposition: A | Discharge status: Home / Self Care | Payer: Medicare HMO | Source: Ambulatory Visit | Attending: Internal Medicine | Admitting: Internal Medicine

## 2021-08-14 DIAGNOSIS — R928 Other abnormal and inconclusive findings on diagnostic imaging of breast: Secondary | ICD-10-CM

## 2022-02-20 ENCOUNTER — Encounter: Payer: Self-pay | Admitting: *Deleted

## 2022-03-28 NOTE — Patient Instructions (Signed)
  Procedure: Colonoscopy  Estimated body mass index is 26.07 kg/m as calculated from the following:   Height as of this encounter: 5\' 1"  (1.549 m).   Weight as of this encounter: 138 lb (62.6 kg).  Have you had a colonoscopy before?  Yes  Do you have family history of colon cancer?  No  Do you have a family history of polyps? No  Previous colonoscopy with polyps removed? Yes  Do you have a history colorectal cancer?   No  Are you diabetic?  Yes, Type 2  Do you have a prosthetic or mechanical heart valve? No  Do you have a pacemaker/defibrillator?   No  Have you had endocarditis/atrial fibrillation?  No  Do you use supplemental oxygen/CPAP?  No  Have you had joint replacement within the last 12 months?  No  Do you tend to be constipated or have to use laxatives?  No   Do you have history of alcohol use? If yes, how much and how often.  No  Do you have history or are you using drugs? If yes, what do are you  using?  No  Have you ever had a stroke/heart attack?  No  Have you ever had a heart or other vascular stent placed? No  Do you take weight loss medication? No  female patients,: have you had a hysterectomy? Yes                              are you post menopausal?  Yes                              do you still have your menstrual cycle? No    Date of last menstrual period? Unknown  Do you take any blood-thinning medications such as: (Plavix, aspirin, Coumadin, Aggrenox, Brilinta, Xarelto, Eliquis, Pradaxa, Savaysa or Effient)? Yes  If yes we need the name, milligram, dosage and who is prescribing doctor:  Aspirin 81 mg, Dr. Yong Channel             Current Outpatient Medications  Medication Sig Dispense Refill   amLODipine (NORVASC) 5 MG tablet Take 5 mg by mouth daily.     aspirin EC 81 MG tablet Take 1 tablet (81 mg total) by mouth daily. Restart on 03/12/15     lisinopril (ZESTRIL) 20 MG tablet 1 tablet Orally Once a day for 30 day(s)     metFORMIN (GLUCOPHAGE)  1000 MG tablet Take 1,000 mg by mouth 2 (two) times daily with a meal.      pioglitazone (ACTOS) 45 MG tablet Take 45 mg by mouth daily.      No current facility-administered medications for this visit.    Allergies  Allergen Reactions   Atorvastatin Other (See Comments)    Muscle cramps.

## 2022-04-03 NOTE — Progress Notes (Signed)
History of MI. ASA 3. OV recommended.

## 2022-04-04 ENCOUNTER — Encounter: Payer: Self-pay | Admitting: Internal Medicine

## 2022-04-04 NOTE — Progress Notes (Signed)
Questionnaire from recall,no referral needed 

## 2022-04-04 NOTE — Progress Notes (Signed)
Please schedule OV as recommended by Loma Sousa

## 2022-05-05 NOTE — H&P (View-Only) (Signed)
  GI Office Note    Referring Provider: Hunter, Denise, MD Primary Care Physician:  Hunter, Denise, MD  Primary Gastroenterologist: Charles K. Carver, DO  Chief Complaint   No chief complaint on file.   History of Present Illness   [name] is a 77 y.o. female presenting today at the request of Hunter, Denise, MD for ***screening colonoscopy.   Last ileocolonoscopy 03/04/15: -***  Last EGD 02/26/13: -***    Today:    Current Outpatient Medications  Medication Sig Dispense Refill   amLODipine (NORVASC) 5 MG tablet Take 5 mg by mouth daily.     aspirin EC 81 MG tablet Take 1 tablet (81 mg total) by mouth daily. Restart on 03/12/15     lisinopril (ZESTRIL) 20 MG tablet 1 tablet Orally Once a day for 30 day(s)     metFORMIN (GLUCOPHAGE) 1000 MG tablet Take 1,000 mg by mouth 2 (two) times daily with a meal.      pioglitazone (ACTOS) 45 MG tablet Take 45 mg by mouth daily.      No current facility-administered medications for this visit.    Past Medical History:  Diagnosis Date   Diabetes (HCC)    High cholesterol    Hypertension    Myocardial infarction (HCC) 2004.    Past Surgical History:  Procedure Laterality Date   ABDOMINAL HYSTERECTOMY     BACK SURGERY     CARDIAC CATHETERIZATION  2004   COLONOSCOPY  02/14/09   SLF:sigmoid colon polyp/small internal hemorrhoids, benign polyp   COLONOSCOPY  02/14/2009   SLF:3-mm sessile sigmoid colon polyp/frequent ascending colon and diverticula/small internal hemorrhoids   COLONOSCOPY N/A 03/04/2015   Procedure: COLONOSCOPY;  Surgeon: Robert M Rourk, MD;  Location: [facility];  Service: Endoscopy;  Laterality: N/A;   ESOPHAGOGASTRODUODENOSCOPY N/A 02/26/2013   Procedure: ESOPHAGOGASTRODUODENOSCOPY (EGD);  Surgeon: Sandi L Fields, MD;  Location: [facility];  Service: Endoscopy;  Laterality: N/A;  9:30   NECK SURGERY      Family History  Problem Relation Age of Onset   Diabetes Mellitus II Mother     Lung cancer Father    Diabetes Mellitus II Sister    Diabetes Mellitus II Sister    Colon cancer Neg Hx     Allergies as of 05/06/2022 - Review Complete 03/28/2022  Allergen Reaction Noted   Atorvastatin Other (See Comments) 07/22/2016    Social History   Socioeconomic History   Marital status: Widowed    Spouse name: Not on file   Number of children: Not on file   Years of education: Not on file   Highest education level: Not on file  Occupational History   Not on file  Tobacco Use   Smoking status: Former    Types: Cigarettes    Quit date: 03/24/2005    Years since quitting: 17.1   Smokeless tobacco: Never  Vaping Use   Vaping Use: Never used  Substance and Sexual Activity   Alcohol use: No    Alcohol/week: 0.0 standard drinks of alcohol   Drug use: No   Sexual activity: Not on file  Other Topics Concern   Not on file  Social History Narrative   Not on file   Social Determinants of Health   Financial Resource Strain: Not on file  Food Insecurity: Not on file  Transportation Needs: Not on file  Physical Activity: Not on file  Stress: Not on file  Social Connections: Not on file  Intimate Partner Violence:   Not on file     Review of Systems   Gen: Denies any fever, chills, fatigue, weight loss, lack of appetite.  CV: Denies chest pain, heart palpitations, peripheral edema, syncope.  Resp: Denies shortness of breath at rest or with exertion. Denies wheezing or cough.  GI: see HPI GU : Denies urinary burning, urinary frequency, urinary hesitancy MS: Denies joint pain, muscle weakness, cramps, or limitation of movement.  Derm: Denies rash, itching, dry skin Psych: Denies depression, anxiety, memory loss, and confusion Heme: Denies bruising, bleeding, and enlarged lymph nodes.   Physical Exam   There were no vitals taken for this visit.  General:   Alert and oriented. Pleasant and cooperative. Well-nourished and well-developed.  Head:  Normocephalic and  atraumatic. Eyes:  Without icterus, sclera clear and conjunctiva pink.  Ears:  Normal auditory acuity. Mouth:  No deformity or lesions, oral mucosa pink.  Lungs:  Clear to auscultation bilaterally. No wheezes, rales, or rhonchi. No distress.  Heart:  S1, S2 present without murmurs appreciated.  Abdomen:  +BS, soft, non-tender and non-distended. No HSM noted. No guarding or rebound. No masses appreciated.  Rectal:  Deferred  Msk:  Symmetrical without gross deformities. Normal posture. Extremities:  Without edema. Neurologic:  Alert and  oriented x4;  grossly normal neurologically. Skin:  Intact without significant lesions or rashes. Psych:  Alert and cooperative. Normal mood and affect.   Assessment   [name] is a 77 y.o. female with a history of *** presenting today with   Screening colon cancer:    PLAN   ***    Artemis Loyal, MSN, FNP-BC, AGACNP-BC [facility] 

## 2022-05-05 NOTE — Progress Notes (Unsigned)
GI Office Note    Referring Provider: Alvina Filbert, MD Primary Care Physician:  Alvina Filbert, MD  Primary Gastroenterologist: Hennie Duos. Marletta Lor, DO  Chief Complaint   No chief complaint on file.   History of Present Illness   [name] is a 77 y.o. female presenting today at the request of Alvina Filbert, MD for ***screening colonoscopy.   Last ileocolonoscopy 03/04/15: -***  Last EGD 02/26/13: -***    Today:    Current Outpatient Medications  Medication Sig Dispense Refill   amLODipine (NORVASC) 5 MG tablet Take 5 mg by mouth daily.     aspirin EC 81 MG tablet Take 1 tablet (81 mg total) by mouth daily. Restart on 03/12/15     lisinopril (ZESTRIL) 20 MG tablet 1 tablet Orally Once a day for 30 day(s)     metFORMIN (GLUCOPHAGE) 1000 MG tablet Take 1,000 mg by mouth 2 (two) times daily with a meal.      pioglitazone (ACTOS) 45 MG tablet Take 45 mg by mouth daily.      No current facility-administered medications for this visit.    Past Medical History:  Diagnosis Date   Diabetes (HCC)    High cholesterol    Hypertension    Myocardial infarction (HCC) 2004.    Past Surgical History:  Procedure Laterality Date   ABDOMINAL HYSTERECTOMY     BACK SURGERY     CARDIAC CATHETERIZATION  2004   COLONOSCOPY  02/14/09   WUJ:WJXBJYN colon polyp/small internal hemorrhoids, benign polyp   COLONOSCOPY  02/14/2009   SLF:3-mm sessile sigmoid colon polyp/frequent ascending colon and diverticula/small internal hemorrhoids   COLONOSCOPY N/A 03/04/2015   Procedure: COLONOSCOPY;  Surgeon: Corbin Ade, MD;  Location: [facility];  Service: Endoscopy;  Laterality: N/A;   ESOPHAGOGASTRODUODENOSCOPY N/A 02/26/2013   Procedure: ESOPHAGOGASTRODUODENOSCOPY (EGD);  Surgeon: West Bali, MD;  Location: [facility];  Service: Endoscopy;  Laterality: N/A;  9:30   NECK SURGERY      Family History  Problem Relation Age of Onset   Diabetes Mellitus II Mother     Lung cancer Father    Diabetes Mellitus II Sister    Diabetes Mellitus II Sister    Colon cancer Neg Hx     Allergies as of 05/06/2022 - Review Complete 03/28/2022  Allergen Reaction Noted   Atorvastatin Other (See Comments) 07/22/2016    Social History   Socioeconomic History   Marital status: Widowed    Spouse name: Not on file   Number of children: Not on file   Years of education: Not on file   Highest education level: Not on file  Occupational History   Not on file  Tobacco Use   Smoking status: Former    Types: Cigarettes    Quit date: 03/24/2005    Years since quitting: 17.1   Smokeless tobacco: Never  Vaping Use   Vaping Use: Never used  Substance and Sexual Activity   Alcohol use: No    Alcohol/week: 0.0 standard drinks of alcohol   Drug use: No   Sexual activity: Not on file  Other Topics Concern   Not on file  Social History Narrative   Not on file   Social Determinants of Health   Financial Resource Strain: Not on file  Food Insecurity: Not on file  Transportation Needs: Not on file  Physical Activity: Not on file  Stress: Not on file  Social Connections: Not on file  Intimate Partner Violence:  Not on file     Review of Systems   Gen: Denies any fever, chills, fatigue, weight loss, lack of appetite.  CV: Denies chest pain, heart palpitations, peripheral edema, syncope.  Resp: Denies shortness of breath at rest or with exertion. Denies wheezing or cough.  GI: see HPI GU : Denies urinary burning, urinary frequency, urinary hesitancy MS: Denies joint pain, muscle weakness, cramps, or limitation of movement.  Derm: Denies rash, itching, dry skin Psych: Denies depression, anxiety, memory loss, and confusion Heme: Denies bruising, bleeding, and enlarged lymph nodes.   Physical Exam   There were no vitals taken for this visit.  General:   Alert and oriented. Pleasant and cooperative. Well-nourished and well-developed.  Head:  Normocephalic and  atraumatic. Eyes:  Without icterus, sclera clear and conjunctiva pink.  Ears:  Normal auditory acuity. Mouth:  No deformity or lesions, oral mucosa pink.  Lungs:  Clear to auscultation bilaterally. No wheezes, rales, or rhonchi. No distress.  Heart:  S1, S2 present without murmurs appreciated.  Abdomen:  +BS, soft, non-tender and non-distended. No HSM noted. No guarding or rebound. No masses appreciated.  Rectal:  Deferred  Msk:  Symmetrical without gross deformities. Normal posture. Extremities:  Without edema. Neurologic:  Alert and  oriented x4;  grossly normal neurologically. Skin:  Intact without significant lesions or rashes. Psych:  Alert and cooperative. Normal mood and affect.   Assessment   [name] is a 77 y.o. female with a history of *** presenting today with   Screening colon cancer:    PLAN   ***    Brooke Bonito, MSN, FNP-BC, AGACNP-BC [facility]

## 2022-05-06 ENCOUNTER — Other Ambulatory Visit: Payer: Self-pay | Admitting: *Deleted

## 2022-05-06 ENCOUNTER — Encounter: Payer: Self-pay | Admitting: Gastroenterology

## 2022-05-06 ENCOUNTER — Encounter: Payer: Self-pay | Admitting: *Deleted

## 2022-05-06 ENCOUNTER — Ambulatory Visit (INDEPENDENT_AMBULATORY_CARE_PROVIDER_SITE_OTHER): Payer: Medicare HMO | Admitting: Gastroenterology

## 2022-05-06 VITALS — BP 141/68 | HR 99 | Temp 97.6°F | Ht 60.0 in | Wt 139.6 lb

## 2022-05-06 DIAGNOSIS — Z86010 Personal history of colonic polyps: Secondary | ICD-10-CM | POA: Diagnosis not present

## 2022-05-06 DIAGNOSIS — R1032 Left lower quadrant pain: Secondary | ICD-10-CM

## 2022-05-06 MED ORDER — PEG 3350-KCL-NA BICARB-NACL 420 G PO SOLR: 4000.0000 mL | Freq: Once | ORAL | 0 refills | Status: AC

## 2022-05-06 NOTE — Patient Instructions (Signed)
We are scheduling for colonoscopy in the near future with Dr. Abbey Chatters.  You will receive separate detailed written instructions regarding your prep.  You will need to hold your metformin the night prior to the morning of your procedure as well as hold her Actos morning of procedure.  This will be outlined in your prep instructions.  If you have any increased discomfort to the left lower quadrant or you have significant pain that develops, please contact the office.  It was a pleasure to see you today. I want to create trusting relationships with patients. If you receive a survey regarding your visit,  I greatly appreciate you taking time to fill this out on paper or through your MyChart. I value your feedback.  Venetia Night, MSN, FNP-BC, AGACNP-BC [facility]

## 2022-05-07 ENCOUNTER — Other Ambulatory Visit (HOSPITAL_COMMUNITY): Payer: Self-pay | Admitting: Internal Medicine

## 2022-05-07 ENCOUNTER — Encounter: Payer: Self-pay | Admitting: *Deleted

## 2022-05-07 DIAGNOSIS — R1032 Left lower quadrant pain: Secondary | ICD-10-CM

## 2022-05-27 NOTE — Patient Instructions (Signed)
[name]  05/27/2022     @PREFPERIOPPHARMACY @   Your procedure is scheduled on  06/03/2022.   Report to [facility] at  [place]  A.M.   Call this number if you have problems the morning of surgery:  [phone]  If you experience any cold or flu symptoms such as cough, fever, chills, shortness of breath, etc. between now and your scheduled surgery, please notify us at the above number.   Remember:  Follow the diet and prep instructions given to you by the office.     DO NOT take any medications for diabetes the morning of your procedure.     Take these medicines the morning of surgery with A SIP OF WATER                                                    amlodipine.     Do not wear jewelry, make-up or nail polish.  Do not wear lotions, powders, or perfumes, or deodorant.  Do not shave 48 hours prior to surgery.  Men may shave face and neck.  Do not bring valuables to the hospital.  [facility] is not responsible for any belongings or valuables.  Contacts, dentures or bridgework may not be worn into surgery.  Leave your suitcase in the car.  After surgery it may be brought to your room.  For patients admitted to the hospital, discharge time will be determined by your treatment team.  Patients discharged the day of surgery will not be allowed to drive home and must have someone with them for 24 hours.    Special instructions:   DO NOT smoke tobacco or vape for 24 hours before your procedure.  Please read over the following fact sheets that you were given. Anesthesia Post-op Instructions and Care and Recovery After Surgery      Colonoscopy, Adult, Care After The following information offers guidance on how to care for yourself after your procedure. Your health care provider may also give you more specific instructions. If you have problems or questions, contact your health care provider. What can I expect after the procedure? After the procedure, it is  common to have: A small amount of blood in your stool for 24 hours after the procedure. Some gas. Mild cramping or bloating of your abdomen. Follow these instructions at home: Eating and drinking  Drink enough fluid to keep your urine pale yellow. Follow instructions from your health care provider about eating or drinking restrictions. Resume your normal diet as told by your health care provider. Avoid heavy or fried foods that are hard to digest. Activity Rest as told by your health care provider. Avoid sitting for a long time without moving. Get up to take short walks every 1-2 hours. This is important to improve blood flow and breathing. Ask for help if you feel weak or unsteady. Return to your normal activities as told by your health care provider. Ask your health care provider what activities are safe for you. Managing cramping and bloating  Try walking around when you have cramps or feel bloated. If directed, apply heat to your abdomen as told by your health care provider. Use the heat source that your health care provider recommends, such as a moist heat pack or a heating pad. Place a towel between  your skin and the heat source. Leave the heat on for 20-30 minutes. Remove the heat if your skin turns bright red. This is especially important if you are unable to feel pain, heat, or cold. You have a greater risk of getting burned. General instructions If you were given a sedative during the procedure, it can affect you for several hours. Do not drive or operate machinery until your health care provider says that it is safe. For the first 24 hours after the procedure: Do not sign important documents. Do not drink alcohol. Do your regular daily activities at a slower pace than normal. Eat soft foods that are easy to digest. Take over-the-counter and prescription medicines only as told by your health care provider. Keep all follow-up visits. This is important. Contact a health care  provider if: You have blood in your stool 2-3 days after the procedure. Get help right away if: You have more than a small spotting of blood in your stool. You have large blood clots in your stool. You have swelling of your abdomen. You have nausea or vomiting. You have a fever. You have increasing pain in your abdomen that is not relieved with medicine. These symptoms may be an emergency. Get help right away. Call 911. Do not wait to see if the symptoms will go away. Do not drive yourself to the hospital. Summary After the procedure, it is common to have a small amount of blood in your stool. You may also have mild cramping and bloating of your abdomen. If you were given a sedative during the procedure, it can affect you for several hours. Do not drive or operate machinery until your health care provider says that it is safe. Get help right away if you have a lot of blood in your stool, nausea or vomiting, a fever, or increased pain in your abdomen. This information is not intended to replace advice given to you by your health care provider. Make sure you discuss any questions you have with your health care provider. Document Revised: 10/18/2020 Document Reviewed: 10/18/2020 Elsevier Patient Education  [place] After The following information offers guidance on how to care for yourself after your procedure. Your health care provider may also give you more specific instructions. If you have problems or questions, contact your health care provider. What can I expect after the procedure? After the procedure, it is common to have: Tiredness. Little or no memory about what happened during or after the procedure. Impaired judgment when it comes to making decisions. Nausea or vomiting. Some trouble with balance. Follow these instructions at home: For the time period you were told by your health care provider:  Rest. Do not participate in activities  where you could fall or become injured. Do not drive or use machinery. Do not drink alcohol. Do not take sleeping pills or medicines that cause drowsiness. Do not make important decisions or sign legal documents. Do not take care of children on your own. Medicines Take over-the-counter and prescription medicines only as told by your health care provider. If you were prescribed antibiotics, take them as told by your health care provider. Do not stop using the antibiotic even if you start to feel better. Eating and drinking Follow instructions from your health care provider about what you may eat and drink. Drink enough fluid to keep your urine pale yellow. If you vomit: Drink clear fluids slowly and in small amounts as you are able. Clear fluids  include water, ice chips, low-calorie sports drinks, and fruit juice that has water added to it (diluted fruit juice). Eat light and bland foods in small amounts as you are able. These foods include bananas, applesauce, rice, lean meats, toast, and crackers. General instructions  Have a responsible adult stay with you for the time you are told. It is important to have someone help care for you until you are awake and alert. If you have sleep apnea, surgery and some medicines can increase your risk for breathing problems. Follow instructions from your health care provider about wearing your sleep device: When you are sleeping. This includes during daytime naps. While taking prescription pain medicines, sleeping medicines, or medicines that make you drowsy. Do not use any products that contain nicotine or tobacco. These products include cigarettes, chewing tobacco, and vaping devices, such as e-cigarettes. If you need help quitting, ask your health care provider. Contact a health care provider if: You feel nauseous or vomit every time you eat or drink. You feel light-headed. You are still sleepy or having trouble with balance after 24 hours. You get a  rash. You have a fever. You have redness or swelling around the IV site. Get help right away if: You have trouble breathing. You have new confusion after you get home. These symptoms may be an emergency. Get help right away. Call 911. Do not wait to see if the symptoms will go away. Do not drive yourself to the hospital. This information is not intended to replace advice given to you by your health care provider. Make sure you discuss any questions you have with your health care provider. Document Revised: 07/23/2021 Document Reviewed: 07/23/2021 Elsevier Patient Education  [place].

## 2022-05-30 ENCOUNTER — Encounter (HOSPITAL_COMMUNITY)
Admission: RE | Admit: 2022-05-30 | Discharge: 2022-05-30 | Disposition: A | Discharge status: Home / Self Care | Payer: Medicare HMO | Source: Ambulatory Visit | Attending: Internal Medicine | Admitting: Internal Medicine

## 2022-05-30 ENCOUNTER — Encounter (HOSPITAL_COMMUNITY): Payer: Self-pay

## 2022-05-30 VITALS — BP 145/62 | HR 88 | Temp 97.5°F | Resp 18 | Ht 60.0 in | Wt 139.6 lb

## 2022-05-30 DIAGNOSIS — D649 Anemia, unspecified: Secondary | ICD-10-CM | POA: Diagnosis not present

## 2022-05-30 DIAGNOSIS — I10 Essential (primary) hypertension: Secondary | ICD-10-CM | POA: Diagnosis not present

## 2022-05-30 DIAGNOSIS — Z01818 Encounter for other preprocedural examination: Secondary | ICD-10-CM | POA: Insufficient documentation

## 2022-05-30 DIAGNOSIS — E119 Type 2 diabetes mellitus without complications: Secondary | ICD-10-CM | POA: Diagnosis not present

## 2022-05-30 LAB — BASIC METABOLIC PANEL
Anion gap: 11 (ref 5–15)
BUN: 16 mg/dL (ref 8–23)
CO2: 23 mmol/L (ref 22–32)
Calcium: 9.2 mg/dL (ref 8.9–10.3)
Chloride: 103 mmol/L (ref 98–111)
Creatinine, Ser: 0.74 mg/dL (ref 0.44–1.00)
GFR, Estimated: >60 mL/min (ref >60)
Glucose, Bld: 146 mg/dL — ABNORMAL HIGH (ref 70–99)
Potassium: 3.8 mmol/L (ref 3.5–5.1)
Sodium: 137 mmol/L (ref 135–145)

## 2022-05-30 LAB — CBC WITH DIFFERENTIAL/PLATELET
Abs Immature Granulocytes: 0 10*3/uL (ref 0.00–0.07)
Basophils Absolute: 0 10*3/uL (ref 0.0–0.1)
Basophils Relative: 1 %
Eosinophils Absolute: 0.1 10*3/uL (ref 0.0–0.5)
Eosinophils Relative: 1 %
HCT: 36 % (ref 36.0–46.0)
Hemoglobin: 11.4 g/dL — ABNORMAL LOW (ref 12.0–15.0)
Immature Granulocytes: 0 %
Lymphocytes Relative: 34 %
Lymphs Abs: 2 10*3/uL (ref 0.7–4.0)
MCH: 31.1 pg (ref 26.0–34.0)
MCHC: 31.7 g/dL (ref 30.0–36.0)
MCV: 98.1 fL (ref 80.0–100.0)
Monocytes Absolute: 0.4 10*3/uL (ref 0.1–1.0)
Monocytes Relative: 8 %
Neutro Abs: 3.4 10*3/uL (ref 1.7–7.7)
Neutrophils Relative %: 56 %
Platelets: 381 10*3/uL (ref 150–400)
RBC: 3.67 MIL/uL — ABNORMAL LOW (ref 3.87–5.11)
RDW: 15.3 % (ref 11.5–15.5)
WBC: 5.9 10*3/uL (ref 4.0–10.5)
nRBC: 0 % (ref 0.0–0.2)

## 2022-06-03 ENCOUNTER — Ambulatory Visit (HOSPITAL_COMMUNITY): Payer: Medicare HMO | Admitting: Anesthesiology

## 2022-06-03 ENCOUNTER — Encounter (HOSPITAL_COMMUNITY)
Admission: RE | Disposition: A | Discharge status: Home / Self Care | Payer: Self-pay | Source: Home / Self Care | Attending: Internal Medicine

## 2022-06-03 ENCOUNTER — Other Ambulatory Visit: Payer: Self-pay

## 2022-06-03 ENCOUNTER — Encounter (HOSPITAL_COMMUNITY): Payer: Self-pay

## 2022-06-03 ENCOUNTER — Ambulatory Visit (HOSPITAL_COMMUNITY)
Admission: RE | Admit: 2022-06-03 | Discharge: 2022-06-03 | Disposition: A | Discharge status: Home / Self Care | Payer: Medicare HMO | Attending: Internal Medicine | Admitting: Internal Medicine

## 2022-06-03 ENCOUNTER — Ambulatory Visit (HOSPITAL_BASED_OUTPATIENT_CLINIC_OR_DEPARTMENT_OTHER): Payer: Medicare HMO | Admitting: Anesthesiology

## 2022-06-03 DIAGNOSIS — D175 Benign lipomatous neoplasm of intra-abdominal organs: Secondary | ICD-10-CM | POA: Diagnosis not present

## 2022-06-03 DIAGNOSIS — E119 Type 2 diabetes mellitus without complications: Secondary | ICD-10-CM | POA: Diagnosis not present

## 2022-06-03 DIAGNOSIS — D759 Disease of blood and blood-forming organs, unspecified: Secondary | ICD-10-CM | POA: Diagnosis not present

## 2022-06-03 DIAGNOSIS — Z87891 Personal history of nicotine dependence: Secondary | ICD-10-CM | POA: Diagnosis not present

## 2022-06-03 DIAGNOSIS — I493 Ventricular premature depolarization: Secondary | ICD-10-CM | POA: Diagnosis not present

## 2022-06-03 DIAGNOSIS — Z7984 Long term (current) use of oral hypoglycemic drugs: Secondary | ICD-10-CM | POA: Insufficient documentation

## 2022-06-03 DIAGNOSIS — Z1211 Encounter for screening for malignant neoplasm of colon: Secondary | ICD-10-CM | POA: Diagnosis not present

## 2022-06-03 DIAGNOSIS — Z86010 Personal history of colonic polyps: Secondary | ICD-10-CM

## 2022-06-03 DIAGNOSIS — D649 Anemia, unspecified: Secondary | ICD-10-CM | POA: Insufficient documentation

## 2022-06-03 DIAGNOSIS — K5730 Diverticulosis of large intestine without perforation or abscess without bleeding: Secondary | ICD-10-CM | POA: Diagnosis not present

## 2022-06-03 DIAGNOSIS — I252 Old myocardial infarction: Secondary | ICD-10-CM

## 2022-06-03 DIAGNOSIS — I2510 Atherosclerotic heart disease of native coronary artery without angina pectoris: Secondary | ICD-10-CM | POA: Insufficient documentation

## 2022-06-03 DIAGNOSIS — D122 Benign neoplasm of ascending colon: Secondary | ICD-10-CM

## 2022-06-03 DIAGNOSIS — I10 Essential (primary) hypertension: Secondary | ICD-10-CM | POA: Diagnosis not present

## 2022-06-03 HISTORY — PX: COLONOSCOPY WITH PROPOFOL: SHX5780

## 2022-06-03 HISTORY — PX: POLYPECTOMY: SHX149

## 2022-06-03 LAB — GLUCOSE, CAPILLARY: Glucose-Capillary: 106 mg/dL — ABNORMAL HIGH (ref 70–99)

## 2022-06-03 SURGERY — COLONOSCOPY WITH PROPOFOL
Anesthesia: General

## 2022-06-03 MED ORDER — LACTATED RINGERS IV SOLN: INTRAVENOUS | Status: DC

## 2022-06-03 MED ORDER — PROPOFOL 10 MG/ML IV BOLUS
INTRAVENOUS | Status: DC | PRN
  Administered 2022-06-03 (×4): 30 mg via INTRAVENOUS
  Administered 2022-06-03: 80 mg via INTRAVENOUS
  Administered 2022-06-03: 30 mg via INTRAVENOUS

## 2022-06-03 MED ORDER — LIDOCAINE HCL (CARDIAC) PF 100 MG/5ML IV SOSY
PREFILLED_SYRINGE | INTRAVENOUS | Status: DC | PRN
  Administered 2022-06-03: 50 mg via INTRAVENOUS

## 2022-06-03 NOTE — Transfer of Care (Signed)
Immediate Anesthesia Transfer of Care Note  Patient: [name]  Procedure(s) Performed: COLONOSCOPY WITH PROPOFOL POLYPECTOMY INTESTINAL  Patient Location: Short Stay  Anesthesia Type:General  Level of Consciousness: awake  Airway & Oxygen Therapy: Patient Spontanous Breathing  Post-op Assessment: Report given to RN and Post -op Vital signs reviewed and stable  Post vital signs: Reviewed and stable  Last Vitals:  Vitals Value Taken Time  BP 95/49 06/03/22 0814  Temp 36.5 C 06/03/22 0814  Pulse 75 06/03/22 0814  Resp 19 06/03/22 0814  SpO2 100 % 06/03/22 0814    Last Pain:  Vitals:   06/03/22 0814  TempSrc: Axillary  PainSc: 0-No pain         Complications: No notable events documented.

## 2022-06-03 NOTE — Anesthesia Preprocedure Evaluation (Signed)
Anesthesia Evaluation  Patient identified by MRN, date of birth, ID band Patient awake    Reviewed: Allergy & Precautions, NPO status , Patient's Chart, lab work & pertinent test results  History of Anesthesia Complications Negative for: history of anesthetic complications  Airway Mallampati: II  TM Distance: >3 FB Neck ROM: Full   Comment: Neck sx Dental  (+) Edentulous Upper, Edentulous Lower   Pulmonary former smoker   Pulmonary exam normal breath sounds clear to auscultation       Cardiovascular Exercise Tolerance: Good hypertension, Pt. on medications + CAD and + Past MI  Normal cardiovascular exam Rhythm:Regular Rate:Normal     Neuro/Psych  negative psych ROS   GI/Hepatic negative GI ROS, Neg liver ROS, Bowel prep,,,  Endo/Other  diabetes, Well Controlled, Type 2, Oral Hypoglycemic Agents    Renal/GU      Musculoskeletal negative musculoskeletal ROS (+)    Abdominal   Peds  Hematology  (+) Blood dyscrasia, anemia   Anesthesia Other Findings   Reproductive/Obstetrics negative OB ROS                             Anesthesia Physical Anesthesia Plan  ASA: 3  Anesthesia Plan: General   Post-op Pain Management: Minimal or no pain anticipated   Induction: Intravenous  PONV Risk Score and Plan: 1 and Treatment may vary due to age or medical condition  Airway Management Planned: Nasal Cannula and Natural Airway  Additional Equipment:   Intra-op Plan:   Post-operative Plan:   Informed Consent: I have reviewed the patients History and Physical, chart, labs and discussed the procedure including the risks, benefits and alternatives for the proposed anesthesia with the patient or authorized representative who has indicated his/her understanding and acceptance.       Plan Discussed with: CRNA and Surgeon  Anesthesia Plan Comments:        Anesthesia Quick Evaluation

## 2022-06-03 NOTE — Interval H&P Note (Signed)
History and Physical Interval Note:  06/03/2022 7:48 AM  [name]  has presented today for surgery, with the diagnosis of history of colon polyps.  The various methods of treatment have been discussed with the patient and family. After consideration of risks, benefits and other options for treatment, the patient has consented to  Procedure(s) with comments: COLONOSCOPY WITH PROPOFOL (N/A) - 10:15 am,asa 3 as a surgical intervention.  The patient's history has been reviewed, patient examined, no change in status, stable for surgery.  I have reviewed the patient's chart and labs.  Questions were answered to the patient's satisfaction.     Eloise Harman

## 2022-06-03 NOTE — Discharge Instructions (Signed)
  Colonoscopy Discharge Instructions  Read the instructions outlined below and refer to this sheet in the next few weeks. These discharge instructions provide you with general information on caring for yourself after you leave the hospital. Your doctor may also give you specific instructions. While your treatment has been planned according to the most current medical practices available, unavoidable complications occasionally occur.   ACTIVITY You may resume your regular activity, but move at a slower pace for the next 24 hours.  Take frequent rest periods for the next 24 hours.  Walking will help get rid of the air and reduce the bloated feeling in your belly (abdomen).  No driving for 24 hours (because of the medicine (anesthesia) used during the test).   Do not sign any important legal documents or operate any machinery for 24 hours (because of the anesthesia used during the test).  NUTRITION Drink plenty of fluids.  You may resume your normal diet as instructed by your doctor.  Begin with a light meal and progress to your normal diet. Heavy or fried foods are harder to digest and may make you feel sick to your stomach (nauseated).  Avoid alcoholic beverages for 24 hours or as instructed.  MEDICATIONS You may resume your normal medications unless your doctor tells you otherwise.  WHAT YOU CAN EXPECT TODAY Some feelings of bloating in the abdomen.  Passage of more gas than usual.  Spotting of blood in your stool or on the toilet paper.  IF YOU HAD POLYPS REMOVED DURING THE COLONOSCOPY: No aspirin products for 7 days or as instructed.  No alcohol for 7 days or as instructed.  Eat a soft diet for the next 24 hours.  FINDING OUT THE RESULTS OF YOUR TEST Not all test results are available during your visit. If your test results are not back during the visit, make an appointment with your caregiver to find out the results. Do not assume everything is normal if you have not heard from your  caregiver or the medical facility. It is important for you to follow up on all of your test results.  SEEK IMMEDIATE MEDICAL ATTENTION IF: You have more than a spotting of blood in your stool.  Your belly is swollen (abdominal distention).  You are nauseated or vomiting.  You have a temperature over 101.  You have abdominal pain or discomfort that is severe or gets worse throughout the day.   Your colonoscopy revealed 1 polyp(s) which I removed successfully. Await pathology results, my office will contact you.  Given your age, I do not think you need further colonoscopy for surveillance purposes.   You also have diverticulosis and internal hemorrhoids. I would recommend increasing fiber in your diet or adding OTC Benefiber/Metamucil. Be sure to drink at least 4 to 6 glasses of water daily. Follow-up with GI as needed.   I hope you have a great rest of your week!  Elon Alas. Abbey Chatters, D.O. Gastroenterology and Hepatology [facility]

## 2022-06-03 NOTE — Op Note (Signed)
[facility] Patient Name: [name] Procedure Date: 06/03/2022 7:49 AM MRN: [id number] Date of Birth: April 01, 1945 Attending MD: Elon Alas. Abbey Chatters , [place], [id number] CSN: [id number] Age: 77 Admit Type: Outpatient Procedure:                Colonoscopy Indications:              Surveillance: Personal history of adenomatous                            polyps on last colonoscopy > 5 years ago Providers:                Elon Alas. Abbey Chatters, DO, Crystal Page, Rosina Lowenstein,                            RN Referring MD:              Medicines:                See the Anesthesia note for documentation of the                            administered medications Complications:            No immediate complications. Estimated Blood Loss:     Estimated blood loss was minimal. Procedure:                Pre-Anesthesia Assessment:                           - The anesthesia plan was to use monitored                            anesthesia care (MAC).                           After obtaining informed consent, the colonoscope                            was passed under direct vision. Throughout the                            procedure, the patient's blood pressure, pulse, and                            oxygen saturations were monitored continuously. The                            [id number] scope was introduced through                            the anus and advanced to the the cecum, identified                            by appendiceal orifice and ileocecal valve. The                            colonoscopy was performed without  difficulty. The                            patient tolerated the procedure well. The quality                            of the bowel preparation was evaluated using the                            BBPS [facility] Bowel Preparation Scale) with scores                            of: Right Colon = 3, Transverse Colon = 3 and Left                            Colon = 3 (entire  mucosa seen well with no residual                            staining, small fragments of stool or opaque                            liquid). The total BBPS score equals 9. Scope In: 8:00:28 AM Scope Out: 8:11:26 AM Scope Withdrawal Time: 0 hours 8 minutes 23 seconds  Total Procedure Duration: 0 hours 10 minutes 58 seconds  Findings:      Multiple large-mouthed and small-mouthed diverticula were found in the       sigmoid colon, transverse colon and ascending colon.      A 4 mm polyp was found in the ascending colon. The polyp was sessile.       The polyp was removed with a cold snare. Resection and retrieval were       complete.      There was a large lipoma, in the transverse colon. Impression:               - Diverticulosis in the sigmoid colon, in the                            transverse colon and in the ascending colon.                           - One 4 mm polyp in the ascending colon, removed                            with a cold snare. Resected and retrieved.                           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 Moderate Sedation:      Per Anesthesia Care Recommendation:           - Patient has a contact number available for                            emergencies. The signs and symptoms of potential  delayed complications were discussed with the                            patient. Return to normal activities tomorrow.                            Written discharge instructions were provided to the                            patient.                           - Resume previous diet.                           - Continue present medications.                           - Await pathology results.                           - No repeat colonoscopy due to age.                           - Return to GI clinic PRN. Procedure Code(s):        --- Professional ---                           [phone], Colonoscopy, flexible; with removal of                             tumor(s), polyp(s), or other lesion(s) by snare                            technique Diagnosis Code(s):        --- Professional ---                           Z86.010, Personal history of colonic polyps                           D12.2, Benign neoplasm of ascending colon                           D17.5, Benign lipomatous neoplasm of                            intra-abdominal organs                           K57.30, Diverticulosis of large intestine without                            perforation or abscess without bleeding CPT copyright 2022 American Medical Association. All rights reserved. The codes documented in this report are preliminary and upon coder review may  be revised to meet current compliance requirements. Elon Alas. Abbey Chatters, DO [place] Abbey Chatters,  DO 06/03/2022 8:16:39 AM This report has been signed electronically. Number of Addenda: 0

## 2022-06-03 NOTE — Anesthesia Postprocedure Evaluation (Signed)
Anesthesia Post Note  Patient: JEIRY JEANPHILIPPE  Procedure(s) Performed: COLONOSCOPY WITH PROPOFOL POLYPECTOMY INTESTINAL  Patient location during evaluation: Phase II Anesthesia Type: General Level of consciousness: awake and alert and oriented Pain management: pain level controlled Vital Signs Assessment: post-procedure vital signs reviewed and stable Respiratory status: spontaneous breathing, nonlabored ventilation and respiratory function stable Cardiovascular status: blood pressure returned to baseline and stable Postop Assessment: no apparent nausea or vomiting Anesthetic complications: no  No notable events documented.   Last Vitals:  Vitals:   06/03/22 0655 06/03/22 0814  BP: (!) 118/91 (!) 95/49  Pulse: 90 75  Resp: (!) 28 19  Temp: 36.8 C 36.5 C  SpO2: 100% 100%    Last Pain:  Vitals:   06/03/22 0814  TempSrc: Axillary  PainSc: 0-No pain                 Greer Koeppen C Manu Rubey

## 2022-06-04 LAB — SURGICAL PATHOLOGY

## 2022-06-11 ENCOUNTER — Ambulatory Visit (HOSPITAL_COMMUNITY)
Admission: RE | Admit: 2022-06-11 | Discharge: 2022-06-11 | Disposition: A | Discharge status: Home / Self Care | Payer: Medicare HMO | Source: Ambulatory Visit | Attending: Internal Medicine | Admitting: Internal Medicine

## 2022-06-11 DIAGNOSIS — R1032 Left lower quadrant pain: Secondary | ICD-10-CM | POA: Diagnosis not present

## 2022-06-11 MED ORDER — IOHEXOL 300 MG/ML  SOLN
100.0000 mL | Freq: Once | INTRAMUSCULAR | Status: AC | PRN
  Administered 2022-06-11: 100 mL via INTRAVENOUS

## 2022-06-12 IMAGING — MG MM DIGITAL SCREENING BILAT W/ TOMO AND CAD
6 of 12 series · 6 of 36 positions shown · non-contrast
Comparison: Previous exam(s).

CLINICAL DATA: Screening.

EXAM:
DIGITAL SCREENING BILATERAL MAMMOGRAM WITH TOMOSYNTHESIS AND CAD
TECHNIQUE: Bilateral screening digital craniocaudal and mediolateral oblique
mammograms were obtained. Bilateral screening digital breast
tomosynthesis was performed. The images were evaluated with
computer-aided detection.

[L CC synth-2D (1 of 2)]
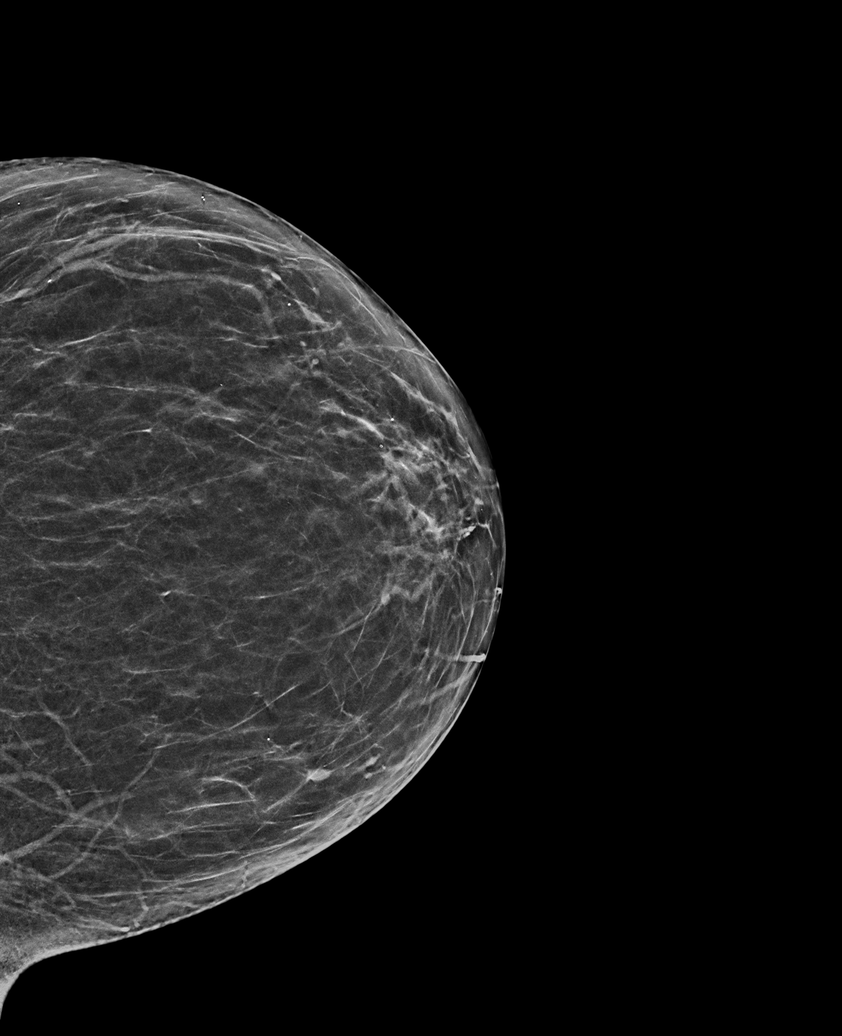

[L CC synth-2D (2 of 2)]
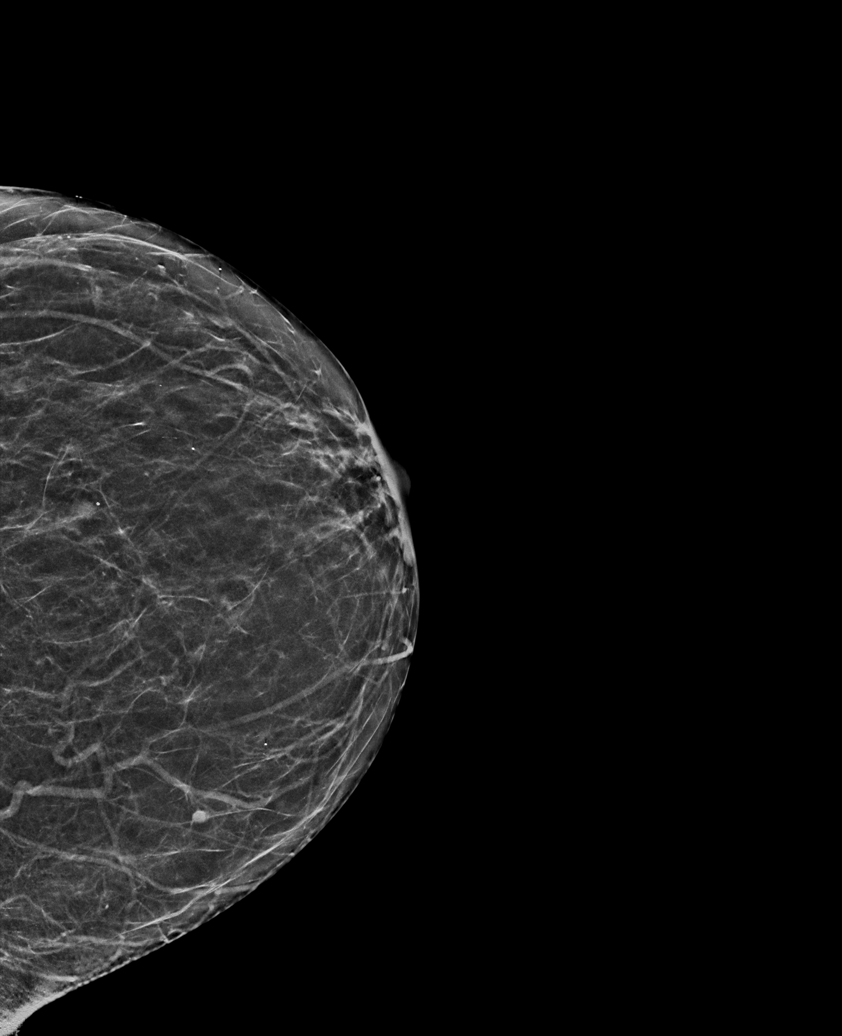

[R MLO synth-2D (1 of 2)]
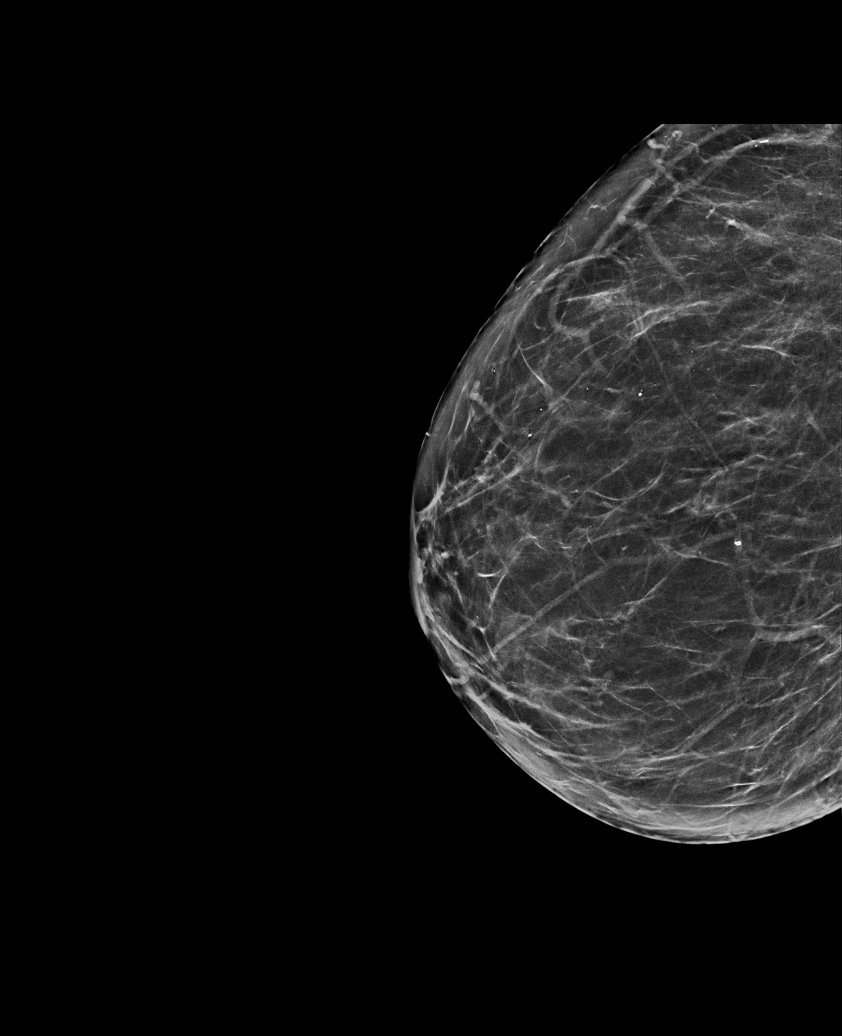

[L MLO synth-2D]
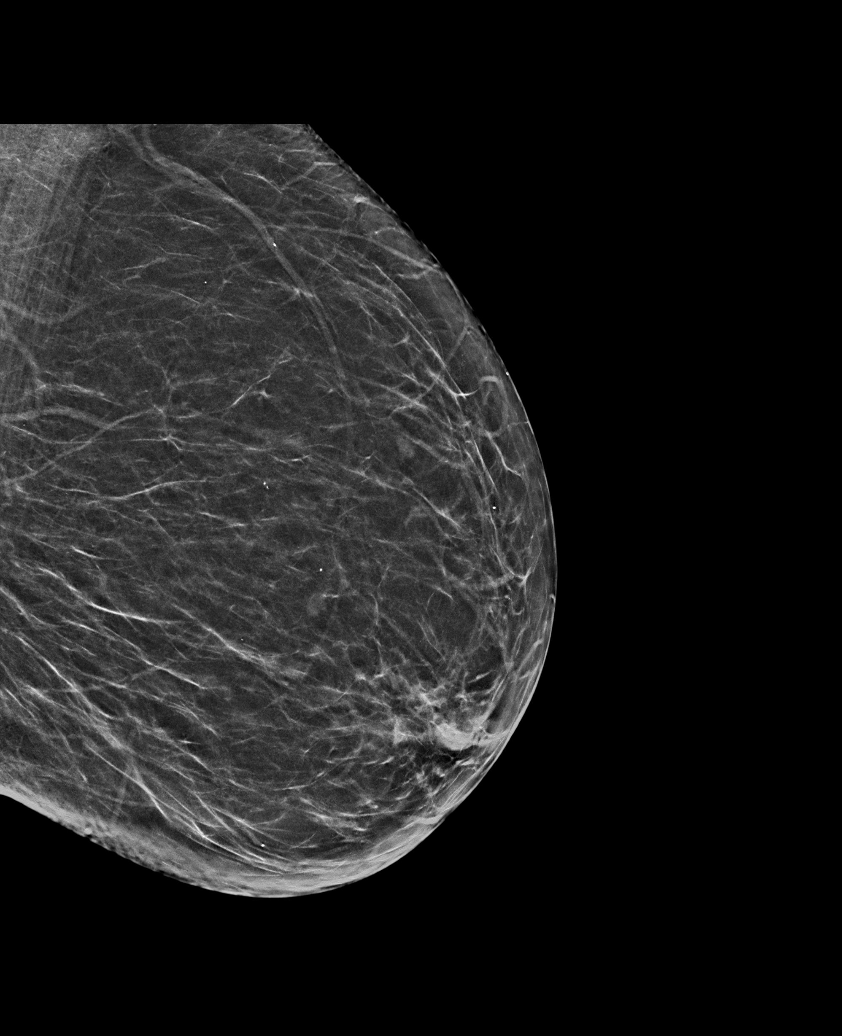

[R CC synth-2D]
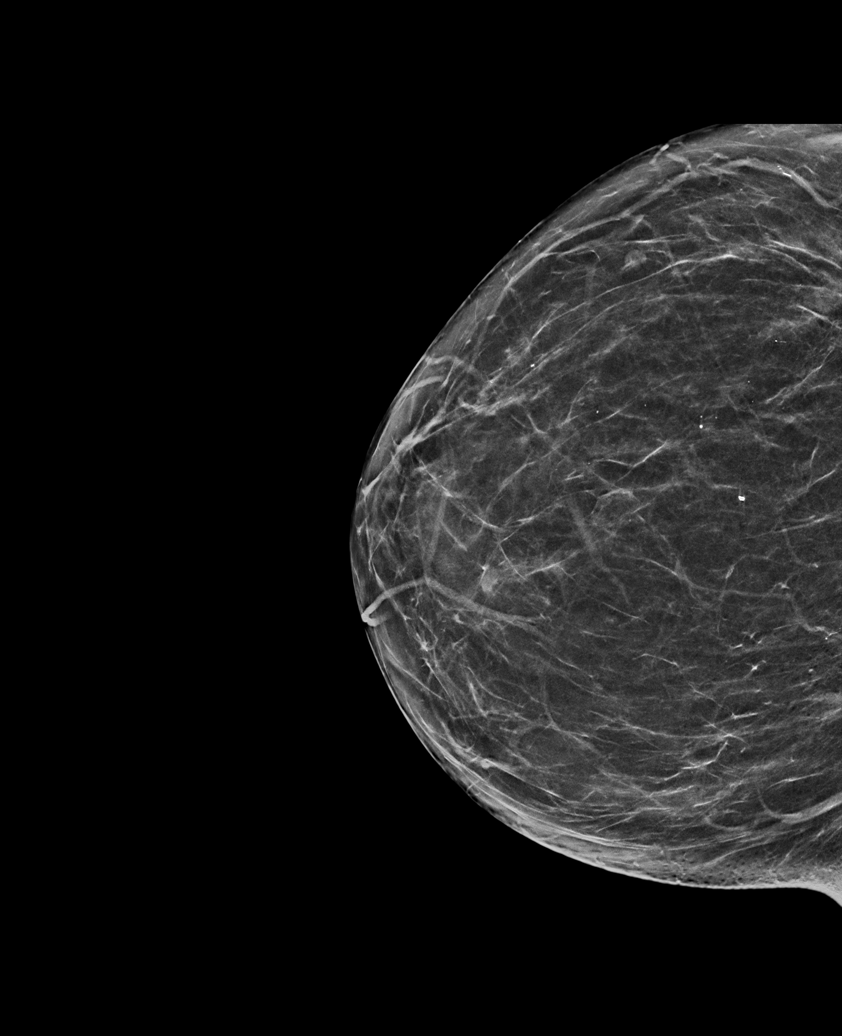

[R MLO synth-2D (2 of 2)]
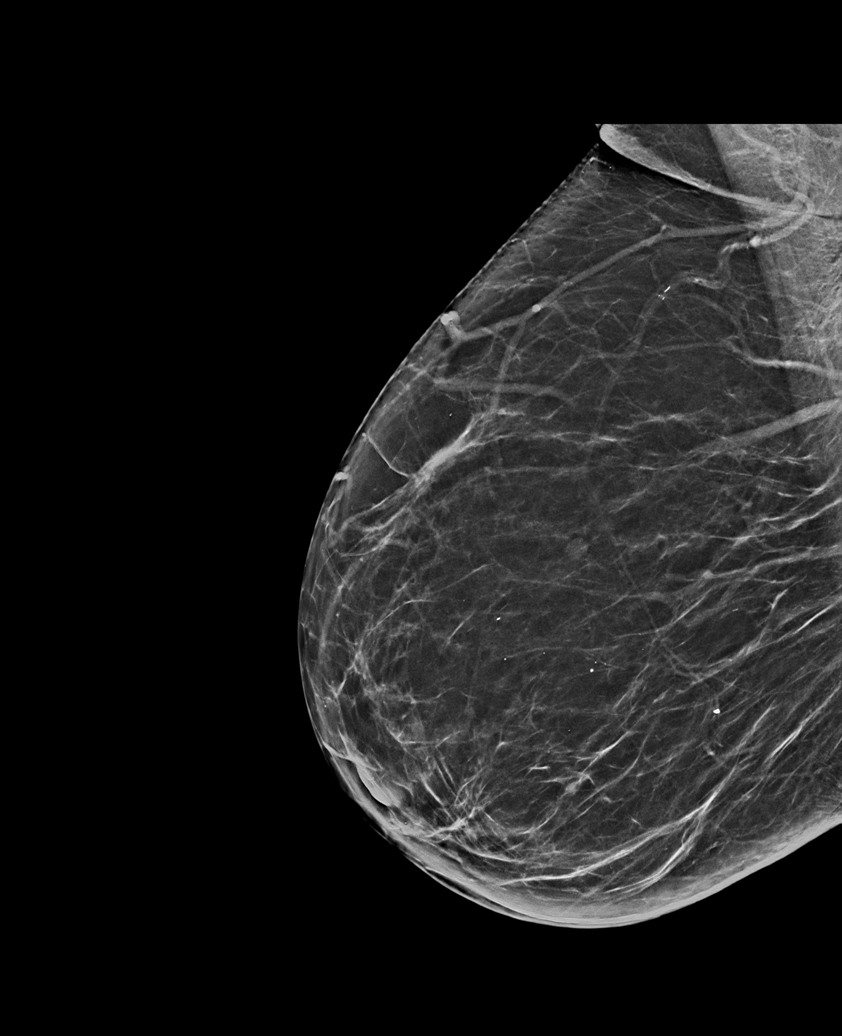

[6 of 36 positions shown; findings below may reference images not displayed]

ACR Breast Density Category b: There are scattered areas of
fibroglandular density.
FINDINGS: In the right breast, a possible asymmetry warrants further
evaluation. In the left breast, no findings suspicious for
malignancy.
IMPRESSION: Further evaluation is suggested for possible asymmetry in the right
breast.

RECOMMENDATION:
Diagnostic mammogram and possibly ultrasound of the right breast.
(Code:BU-[id number])

The patient will be contacted regarding the findings, and additional
imaging will be scheduled.

BI-RADS CATEGORY  0: Incomplete. Need additional imaging evaluation
and/or prior mammograms for comparison.

## 2022-06-13 ENCOUNTER — Encounter (HOSPITAL_COMMUNITY): Payer: Self-pay | Admitting: Internal Medicine

## 2022-06-23 ENCOUNTER — Other Ambulatory Visit: Payer: Self-pay

## 2022-06-23 ENCOUNTER — Encounter (HOSPITAL_COMMUNITY): Payer: Self-pay | Admitting: *Deleted

## 2022-06-23 ENCOUNTER — Emergency Department (HOSPITAL_COMMUNITY)
Admission: EM | Admit: 2022-06-23 | Discharge: 2022-06-23 | Disposition: A | Discharge status: Home / Self Care | Payer: Medicare HMO | Attending: Emergency Medicine | Admitting: Emergency Medicine

## 2022-06-23 DIAGNOSIS — Z7984 Long term (current) use of oral hypoglycemic drugs: Secondary | ICD-10-CM | POA: Insufficient documentation

## 2022-06-23 DIAGNOSIS — R1030 Lower abdominal pain, unspecified: Secondary | ICD-10-CM | POA: Diagnosis present

## 2022-06-23 DIAGNOSIS — Z79899 Other long term (current) drug therapy: Secondary | ICD-10-CM | POA: Diagnosis not present

## 2022-06-23 DIAGNOSIS — I10 Essential (primary) hypertension: Secondary | ICD-10-CM | POA: Insufficient documentation

## 2022-06-23 DIAGNOSIS — E119 Type 2 diabetes mellitus without complications: Secondary | ICD-10-CM | POA: Diagnosis not present

## 2022-06-23 DIAGNOSIS — N3090 Cystitis, unspecified without hematuria: Secondary | ICD-10-CM | POA: Diagnosis not present

## 2022-06-23 DIAGNOSIS — Z7982 Long term (current) use of aspirin: Secondary | ICD-10-CM | POA: Diagnosis not present

## 2022-06-23 LAB — COMPREHENSIVE METABOLIC PANEL
ALT: 7 U/L (ref 0–44)
AST: 15 U/L (ref 15–41)
Albumin: 3.7 g/dL (ref 3.5–5.0)
Alkaline Phosphatase: 71 U/L (ref 38–126)
Anion gap: 11 (ref 5–15)
BUN: 15 mg/dL (ref 8–23)
CO2: 23 mmol/L (ref 22–32)
Calcium: 9.4 mg/dL (ref 8.9–10.3)
Chloride: 104 mmol/L (ref 98–111)
Creatinine, Ser: 0.73 mg/dL (ref 0.44–1.00)
GFR, Estimated: >60 mL/min (ref >60)
Glucose, Bld: 89 mg/dL (ref 70–99)
Potassium: 4.8 mmol/L (ref 3.5–5.1)
Sodium: 138 mmol/L (ref 135–145)
Total Bilirubin: 0.5 mg/dL (ref 0.3–1.2)
Total Protein: 7.5 g/dL (ref 6.5–8.1)

## 2022-06-23 LAB — CBC
HCT: 35.5 % — ABNORMAL LOW (ref 36.0–46.0)
Hemoglobin: 11.1 g/dL — ABNORMAL LOW (ref 12.0–15.0)
MCH: 30.7 pg (ref 26.0–34.0)
MCHC: 31.3 g/dL (ref 30.0–36.0)
MCV: 98.3 fL (ref 80.0–100.0)
Platelets: 383 10*3/uL (ref 150–400)
RBC: 3.61 MIL/uL — ABNORMAL LOW (ref 3.87–5.11)
RDW: 15.1 % (ref 11.5–15.5)
WBC: 6.8 10*3/uL (ref 4.0–10.5)
nRBC: 0 % (ref 0.0–0.2)

## 2022-06-23 LAB — URINALYSIS, ROUTINE W REFLEX MICROSCOPIC
Bilirubin Urine: NEGATIVE
Glucose, UA: NEGATIVE mg/dL
Hgb urine dipstick: NEGATIVE
Ketones, ur: 5 mg/dL — AB
Nitrite: NEGATIVE
Protein, ur: 30 mg/dL — AB
Renal Epithelial: 1
Specific Gravity, Urine: 1.032 — ABNORMAL HIGH (ref 1.005–1.030)
WBC, UA: >50 WBC/hpf (ref 0–5)
pH: 5 (ref 5.0–8.0)

## 2022-06-23 LAB — LIPASE, BLOOD: Lipase: 33 U/L (ref 11–51)

## 2022-06-23 MED ORDER — CEPHALEXIN 500 MG PO CAPS
500.0000 mg | ORAL_CAPSULE | Freq: Once | ORAL | Status: AC
  Administered 2022-06-23: 500 mg via ORAL
  Filled 2022-06-23: qty 1

## 2022-06-23 MED ORDER — CEPHALEXIN 500 MG PO CAPS: 500.0000 mg | ORAL_CAPSULE | Freq: Three times a day (TID) | ORAL | 0 refills | Status: AC

## 2022-06-23 NOTE — ED Provider Notes (Signed)
[place] EMERGENCY DEPARTMENT AT [facility] Provider Note   CSN: [id number] Arrival date & time: 06/23/22  1316     History  Chief Complaint  Patient presents with   Abdominal Pain    [name] is a 77 y.o. female.   Abdominal Pain  This patient is a 77 year old female, she has a history of hypertension and diabetes, she takes Actos metformin and amlodipine.  She has been having some lower abdominal discomfort which has been ongoing now for several weeks, she did see her family doctor who ordered a CT scan but has not ordered any lab work according to her.  I have reviewed the report from June 11, 2022 which showed no acute intra-abdominal or pelvic findings.  Evidently she did have a colonoscopy March 24.  She denies urinary symptoms, bowel symptoms, nausea vomiting fevers or chills, the pain is in the left abdomen and seems to be worse with movement, denies any other symptoms    Home Medications Prior to Admission medications   Medication Sig Start Date End Date Taking? Authorizing Provider  cephALEXin (KEFLEX) 500 MG capsule Take 1 capsule (500 mg total) by mouth 3 (three) times daily for 7 days. 06/23/22 06/30/22 Yes Eber Hong, MD  acetaminophen (TYLENOL) 500 MG tablet Take 500 mg by mouth every 6 (six) hours as needed for moderate pain.    [provider]  amLODipine (NORVASC) 5 MG tablet Take 5 mg by mouth daily. 01/20/17   [provider]  aspirin EC 81 MG tablet Take 1 tablet (81 mg total) by mouth daily. Restart on 03/12/15 03/05/15   Erick Blinks, MD  Carboxymethylcellulose Sodium (LUBRICANT EYE DROPS OP) Place 1 drop into both eyes daily as needed (dry eyes).    [provider]  lisinopril (ZESTRIL) 20 MG tablet Take 20 mg by mouth daily. 03/14/21   [provider]  metFORMIN (GLUCOPHAGE) 1000 MG tablet Take 1,000 mg by mouth 2 (two) times daily with a meal.  02/04/15   [provider]  pioglitazone  (ACTOS) 45 MG tablet Take 45 mg by mouth daily.  02/17/14   [provider]      Allergies    Atorvastatin    Review of Systems   Review of Systems  Gastrointestinal:  Positive for abdominal pain.  All other systems reviewed and are negative.   Physical Exam Updated Vital Signs BP (!) 141/71 (BP Location: Right Arm)   Pulse 94   Temp 98.3 F (36.8 C) (Oral)   Resp 16   Ht 1.549 m ( )   Wt 62.6 kg   SpO2 98%   BMI 26.07 kg/m  Physical Exam Vitals and nursing note reviewed.  Constitutional:      General: She is not in acute distress.    Appearance: She is well-developed.  HENT:     Head: Normocephalic and atraumatic.     Mouth/Throat:     Pharynx: No oropharyngeal exudate.  Eyes:     General: No scleral icterus.       Right eye: No discharge.        Left eye: No discharge.     Conjunctiva/sclera: Conjunctivae normal.     Pupils: Pupils are equal, round, and reactive to light.  Neck:     Thyroid: No thyromegaly.     Vascular: No JVD.  Cardiovascular:     Rate and Rhythm: Normal rate and regular rhythm.     Heart sounds: Normal heart sounds. No  murmur heard.    No friction rub. No gallop.  Pulmonary:     Effort: Pulmonary effort is normal. No respiratory distress.     Breath sounds: Normal breath sounds. No wheezing or rales.  Abdominal:     General: Bowel sounds are normal. There is no distension.     Palpations: Abdomen is soft. There is no mass.     Tenderness: There is no abdominal tenderness.  Musculoskeletal:        General: No tenderness. Normal range of motion.     Cervical back: Normal range of motion and neck supple.  Lymphadenopathy:     Cervical: No cervical adenopathy.  Skin:    General: Skin is warm and dry.     Findings: No erythema or rash.  Neurological:     Mental Status: She is alert.     Coordination: Coordination normal.  Psychiatric:        Behavior: Behavior normal.     ED Results / Procedures / Treatments    Labs (all labs ordered are listed, but only abnormal results are displayed) Labs Reviewed  CBC - Abnormal; Notable for the following components:      Result Value   RBC 3.61 (*)    Hemoglobin 11.1 (*)    HCT 35.5 (*)    All other components within normal limits  URINALYSIS, ROUTINE W REFLEX MICROSCOPIC - Abnormal; Notable for the following components:   APPearance CLOUDY (*)    Specific Gravity, Urine 1.032 (*)    Ketones, ur 5 (*)    Protein, ur 30 (*)    Leukocytes,Ua LARGE (*)    Bacteria, UA FEW (*)    All other components within normal limits  LIPASE, BLOOD  COMPREHENSIVE METABOLIC PANEL    EKG None  Radiology No results found.  Procedures Procedures    Medications Ordered in ED Medications  cephALEXin (KEFLEX) capsule 500 mg (has no administration in time range)    ED Course/ Medical Decision Making/ A&P                             Medical Decision Making Amount and/or Complexity of Data Reviewed Labs: ordered.   This patient presents to the ED for concern of lower abdominal pain on the left, this involves an extensive number of treatment options, and is a complaint that carries with it a high risk of complications and morbidity.  The differential diagnosis includes abdominal aneurysm, diverticulitis, urinary tract infection or pyelonephritis, hernia, musculoskeletal wall pain   Co morbidities that complicate the patient evaluation  Obesity   Additional history obtained:  Additional history obtained from electronic medical record External records from outside source obtained and reviewed including prior CT scan from 2 weeks ago   Lab Tests:  I Ordered, and personally interpreted labs.  The pertinent results include: CBC metabolic panel and urinalysis, labs are unremarkable except for the urinalysis which confirms UTI   Imaging Studies ordered:  None, the patient has a nonsurgical abdomen and a recent CT scan which was unremarkable  Cardiac  Monitoring: / EKG:  The patient was maintained on a cardiac monitor.  I personally viewed and interpreted the cardiac monitored which showed an underlying rhythm of: Normal sinus rhythm   Consultations Obtained:  Patient follow-up reasonable  Problem List / ED Course / Critical interventions / Medication management  UTI I ordered medication including cephalexin for UTI Reevaluation of the patient after these medicines  showed that the patient stable I have reviewed the patients home medicines and have made adjustments as needed   Social Determinants of Health:  None   Test / Admission - Considered:  Considered admission but the patient is well-appearing with a nonsurgical abdomen, normal vital signs and a confirmed UTI, she has no signs of pyelonephritis or sepsis.         Final Clinical Impression(s) / ED Diagnoses Final diagnoses:  Cystitis    Rx / DC Orders ED Discharge Orders          Ordered    cephALEXin (KEFLEX) 500 MG capsule  3 times daily        06/23/22 1620              Eber Hong, MD 06/23/22 1620

## 2022-06-23 NOTE — Discharge Instructions (Signed)
It appears that you do have a bladder infection.  Please take the medication called cephalexin 3 times a day as prescribed for the next 7 days given the prolonged time that you have had pain.  If you are developing increasing amounts of pain vomiting or fever return to the ER, have your doctor follow-up with you in 3 days if no improvement.  I did review your CT scan from earlier this month and it was unremarkable, there is no signs of swelling or any other problems

## 2022-07-05 ENCOUNTER — Ambulatory Visit (HOSPITAL_COMMUNITY): Payer: Medicare HMO

## 2022-08-29 ENCOUNTER — Other Ambulatory Visit (HOSPITAL_COMMUNITY): Payer: Self-pay | Admitting: Internal Medicine

## 2022-08-29 DIAGNOSIS — Z1231 Encounter for screening mammogram for malignant neoplasm of breast: Secondary | ICD-10-CM

## 2022-09-04 ENCOUNTER — Ambulatory Visit (HOSPITAL_COMMUNITY)
Admission: RE | Admit: 2022-09-04 | Discharge: 2022-09-04 | Disposition: A | Discharge status: Home / Self Care | Payer: Medicare HMO | Source: Ambulatory Visit | Attending: Internal Medicine | Admitting: Internal Medicine

## 2022-09-04 ENCOUNTER — Encounter (HOSPITAL_COMMUNITY): Payer: Self-pay

## 2022-09-04 DIAGNOSIS — Z1231 Encounter for screening mammogram for malignant neoplasm of breast: Secondary | ICD-10-CM | POA: Diagnosis present

## 2022-12-21 ENCOUNTER — Emergency Department (HOSPITAL_COMMUNITY): Payer: Medicare HMO

## 2022-12-21 ENCOUNTER — Encounter (HOSPITAL_COMMUNITY): Payer: Self-pay

## 2022-12-21 ENCOUNTER — Other Ambulatory Visit: Payer: Self-pay

## 2022-12-21 ENCOUNTER — Emergency Department (HOSPITAL_COMMUNITY)
Admission: EM | Admit: 2022-12-21 | Discharge: 2022-12-21 | Disposition: A | Discharge status: Home / Self Care | Payer: Medicare HMO | Attending: Emergency Medicine | Admitting: Emergency Medicine

## 2022-12-21 DIAGNOSIS — Z79899 Other long term (current) drug therapy: Secondary | ICD-10-CM | POA: Insufficient documentation

## 2022-12-21 DIAGNOSIS — I10 Essential (primary) hypertension: Secondary | ICD-10-CM | POA: Diagnosis not present

## 2022-12-21 DIAGNOSIS — R1032 Left lower quadrant pain: Secondary | ICD-10-CM | POA: Insufficient documentation

## 2022-12-21 DIAGNOSIS — E119 Type 2 diabetes mellitus without complications: Secondary | ICD-10-CM | POA: Diagnosis not present

## 2022-12-21 DIAGNOSIS — Z7982 Long term (current) use of aspirin: Secondary | ICD-10-CM | POA: Diagnosis not present

## 2022-12-21 MED ORDER — LIDOCAINE 5 % EX PTCH
1.0000 | MEDICATED_PATCH | CUTANEOUS | Status: DC
  Administered 2022-12-21: 1 via TRANSDERMAL
  Filled 2022-12-21: qty 1

## 2022-12-21 MED ORDER — HYDROCODONE-ACETAMINOPHEN 5-325 MG PO TABS: 1.0000 | ORAL_TABLET | ORAL | 0 refills | Status: AC | PRN

## 2022-12-21 MED ORDER — HYDROCODONE-ACETAMINOPHEN 5-325 MG PO TABS
1.0000 | ORAL_TABLET | ORAL | Status: AC
  Administered 2022-12-21: 1 via ORAL
  Filled 2022-12-21: qty 1

## 2022-12-21 NOTE — Discharge Instructions (Signed)
You were seen for your groin pain in the emergency department.   At home, please take Tylenol and the Norco for any breakthrough pain that you may have.  Use over-the-counter lidocaine patches for pain.    Check your MyChart online for the results of any tests that had not resulted by the time you left the emergency department.   Follow-up with your primary doctor in 2-3 days regarding your visit.    Return immediately to the emergency department if you experience any of the following: Worsening pain, or any other concerning symptoms.    Thank you for visiting our Emergency Department. It was a pleasure taking care of you today.

## 2022-12-21 NOTE — ED Triage Notes (Signed)
LEFT sided groin pain Pt stated that she twisted and it feels like she pulled a muscle

## 2022-12-21 NOTE — ED Provider Notes (Signed)
[place] EMERGENCY DEPARTMENT AT [facility] Provider Note   CSN: [id number] Arrival date & time: 12/21/22  1750     History {Add pertinent medical, surgical, social history, OB history to HPI:1} Chief Complaint  Patient presents with   Groin Pain    [name] is a 77 y.o. female.  77 year old female with history of hypertension and diabetes and diverticulosis who presents emergency department with left lower quadrant abdominal pain.  Patient reports that earlier today she was in the kitchen and turned and felt sharp pain in her left lower quadrant.  Says it is 10/10 in severity.  Has not tried any medications for it yet.  Worsened with movement.  Had this happen earlier in the year with a reassuring evaluation which included a CT scan.  No back pain.  No constipation or diarrhea.  No fevers.  No dysuria or frequency.       Home Medications Prior to Admission medications   Medication Sig Start Date End Date Taking? Authorizing Provider  acetaminophen (TYLENOL) 500 MG tablet Take 500 mg by mouth every 6 (six) hours as needed for moderate pain.    [provider]  amLODipine (NORVASC) 5 MG tablet Take 5 mg by mouth daily. 01/20/17   [provider]  aspirin EC 81 MG tablet Take 1 tablet (81 mg total) by mouth daily. Restart on 03/12/15 03/05/15   Erick Blinks, MD  Carboxymethylcellulose Sodium (LUBRICANT EYE DROPS OP) Place 1 drop into both eyes daily as needed (dry eyes).    [provider]  lisinopril (ZESTRIL) 20 MG tablet Take 20 mg by mouth daily. 03/14/21   [provider]  metFORMIN (GLUCOPHAGE) 1000 MG tablet Take 1,000 mg by mouth 2 (two) times daily with a meal.  02/04/15   [provider]  pioglitazone (ACTOS) 45 MG tablet Take 45 mg by mouth daily.  02/17/14   [provider]      Allergies    Atorvastatin    Review of Systems   Review of Systems  Physical Exam Updated Vital Signs BP  133/77 (BP Location: Right Arm)   Pulse 90   Temp 98.5 F (36.9 C)   Resp 16   Ht 5\' 1"  (1.549 m)   Wt 59.9 kg   SpO2 99%   BMI 24.94 kg/m  Physical Exam Vitals and nursing note reviewed.  Constitutional:      General: She is not in acute distress.    Appearance: She is well-developed.     Comments: Uncomfortable appearing  HENT:     Head: Normocephalic and atraumatic.     Right Ear: External ear normal.     Left Ear: External ear normal.     Nose: Nose normal.  Eyes:     Extraocular Movements: Extraocular movements intact.     Conjunctiva/sclera: Conjunctivae normal.     Pupils: Pupils are equal, round, and reactive to light.  Cardiovascular:     Rate and Rhythm: Normal rate and regular rhythm.     Heart sounds: No murmur heard. Pulmonary:     Effort: Pulmonary effort is normal. No respiratory distress.     Breath sounds: Normal breath sounds.  Abdominal:     General: Abdomen is flat. There is no distension.     Palpations: Abdomen is soft. There is no mass.     Tenderness: There is abdominal tenderness (Left lower quadrant). There is no right CVA tenderness, left CVA tenderness or guarding.  Musculoskeletal:  Cervical back: Normal range of motion and neck supple.     Right lower leg: No edema.     Left lower leg: No edema.     Comments: No significant left hip tenderness to palpation.  Full active and passive range of motion.  Active active range of motion does elicit some pain  Skin:    General: Skin is warm and dry.  Neurological:     Mental Status: She is alert and oriented to person, place, and time. Mental status is at baseline.  Psychiatric:        Mood and Affect: Mood normal.     ED Results / Procedures / Treatments   Labs (all labs ordered are listed, but only abnormal results are displayed) Labs Reviewed - No data to display  EKG None  Radiology No results found.  Procedures Procedures  {Document cardiac monitor, telemetry assessment  procedure when appropriate:1}  Medications Ordered in ED Medications - No data to display  ED Course/ Medical Decision Making/ A&P   {   Click here for ABCD2, HEART and other calculatorsREFRESH Note before signing :1}                              Medical Decision Making Amount and/or Complexity of Data Reviewed Labs: ordered. Radiology: ordered.  Risk Prescription drug management.   ***  {Document critical care time when appropriate:1} {Document review of labs and clinical decision tools ie heart score, Chads2Vasc2 etc:1}  {Document your independent review of radiology images, and any outside records:1} {Document your discussion with family members, caretakers, and with consultants:1} {Document social determinants of health affecting pt's care:1} {Document your decision making why or why not admission, treatments were needed:1} Final Clinical Impression(s) / ED Diagnoses Final diagnoses:  None    Rx / DC Orders ED Discharge Orders     None

## 2023-01-10 ENCOUNTER — Encounter: Payer: Self-pay | Admitting: Orthopedic Surgery

## 2023-01-10 ENCOUNTER — Ambulatory Visit: Payer: Medicare HMO | Admitting: Orthopedic Surgery

## 2023-01-10 ENCOUNTER — Other Ambulatory Visit (INDEPENDENT_AMBULATORY_CARE_PROVIDER_SITE_OTHER): Payer: Self-pay

## 2023-01-10 VITALS — BP 134/81 | HR 84 | Ht 61.0 in | Wt 134.0 lb

## 2023-01-10 DIAGNOSIS — M25552 Pain in left hip: Secondary | ICD-10-CM | POA: Diagnosis not present

## 2023-01-10 DIAGNOSIS — M1612 Unilateral primary osteoarthritis, left hip: Secondary | ICD-10-CM | POA: Diagnosis not present

## 2023-01-10 NOTE — Progress Notes (Signed)
New Patient Visit  Assessment: [name] is a 77 y.o. female with the following: 1. Pain in left hip  Plan: [name] has pain in the anterior aspect of the left hip.  No specific injury.  She has had 2 episodes in the past 10 months.  Currently, the pain is doing a little bit better.  She localizes the pain to the anterior aspect of the pelvic rim, and into the lower abdomen.  We reviewed radiographs in clinic today which demonstrates some mild degenerative changes.  I am unable to recreate her pain with manipulation of the left hip.  She has no pain in the groin with internal or external rotation of the hip.  I do not think that her current symptoms are related to intra-articular hip pathology.  Description and location of her pain consistent with a muscular injury.  She states that the pain was similar to muscular pain.  Nonetheless, I do not know the exact etiology.  If she has another flare, I have urged to return to clinic.  Otherwise, with medicines as needed.  Activities as tolerated.  Follow-up: Return if symptoms worsen or fail to improve.  Subjective:  Chief Complaint  Patient presents with   Hip Pain    L side groin pain started originally in Jan was treated for it but doesn't feel like the treatment help but pain gradually went away. Pain came back 1 mo ago after stepping over something while trying to turn and felt the pain again. Pain is getting better but still feels something.     History of Present Illness: [name] is a 77 y.o. female who has been referred by  Alvina Filbert, MD for evaluation of left hip pain.  She is currently complaining of pain in the anterior aspect of the left hip.  This has been ongoing for a few weeks.  She states she had a similar type episode at the beginning of the year.  She presented the ED.  At that time, she had a CT scan, which was negative for abdominal or intra-articular pathology.  The pain gradually  resolved.  Approximately month ago, the pain returned after lifting her leg up, and trying to step over something.  Once again, she had severe pain in the anterior aspect of the left hip, and presented to the ED.  Repeat CT scan once again was without identifiable pathology.  Since then, she has had some improvement in her symptoms.  Medications have been helpful.  She saw her primary care doctor, who recommended an x-ray.  X-ray noted some arthritis, and she was referred to clinic.  Overall, she is doing better currently.  No history of injection of the left hip.  She localizes the pain to the anterior aspect of the hip, in line with the anterior pelvic rim on the left.   Review of Systems: No fevers or chills No numbness or tingling No chest pain No shortness of breath No bowel or bladder dysfunction No GI distress No headaches   Medical History:  Past Medical History:  Diagnosis Date   Diabetes (HCC)    High cholesterol    Hypertension    Myocardial infarction (HCC) 2004.    Past Surgical History:  Procedure Laterality Date   ABDOMINAL HYSTERECTOMY     BACK SURGERY     CARDIAC CATHETERIZATION  2004   COLONOSCOPY  02/14/09   [id number] colon polyp/small internal hemorrhoids, benign polyp   COLONOSCOPY  02/14/2009  SLF:3-mm sessile sigmoid colon polyp/frequent ascending colon and diverticula/small internal hemorrhoids   COLONOSCOPY N/A 03/04/2015   Procedure: COLONOSCOPY;  Surgeon: Corbin Ade, MD;  Location: [facility];  Service: Endoscopy;  Laterality: N/A;   COLONOSCOPY WITH PROPOFOL N/A 06/03/2022   Procedure: COLONOSCOPY WITH PROPOFOL;  Surgeon: Lanelle Bal, DO;  Location: [facility];  Service: Endoscopy;  Laterality: N/A;  10:15 am,asa 3   ESOPHAGOGASTRODUODENOSCOPY N/A 02/26/2013   Procedure: ESOPHAGOGASTRODUODENOSCOPY (EGD);  Surgeon: West Bali, MD;  Location: [facility];  Service: Endoscopy;  Laterality: N/A;  9:30   NECK SURGERY     POLYPECTOMY   06/03/2022   Procedure: POLYPECTOMY INTESTINAL;  Surgeon: Lanelle Bal, DO;  Location: [facility];  Service: Endoscopy;;    Family History  Problem Relation Age of Onset   Diabetes Mellitus II Mother    Lung cancer Father    Diabetes Mellitus II Sister    Diabetes Mellitus II Sister    Colon cancer Neg Hx    Social History   Tobacco Use   Smoking status: Former    Current packs/day: 0.00    Types: Cigarettes    Quit date: 03/24/2005    Years since quitting: 17.8   Smokeless tobacco: Never  Vaping Use   Vaping status: Never Used  Substance Use Topics   Alcohol use: No    Alcohol/week: 0.0 standard drinks of alcohol   Drug use: No    Allergies  Allergen Reactions   Atorvastatin Other (See Comments)    Muscle cramps.    No outpatient medications have been marked as taking for the 01/10/23 encounter (Office Visit) with Oliver Barre, MD.    Objective: BP 134/81   Pulse 84   Ht 5\' 1"  (1.549 m)   Wt 134 lb (60.8 kg)   BMI 25.32 kg/m   Physical Exam:  General: Elderly female., Alert and oriented., and No acute distress. Gait: Normal gait.  Evaluation of the left hip demonstrates no deformity.  No swelling.  No point tenderness.  She was complaining of pain over the anterior aspect of the pelvic rim of the left, and within the lower abdominal area.  She has no pain in the groin.  She tolerates gentle range of motion of the left hip.  She tolerates 15 degrees of internal rotation, as well as 30 degrees of external rotation without recreating her pain.  She is able to maintain straight leg raise.  She has good strength in the anterior aspect of the left hip.  Negative straight leg raise.  Good lower body strength.  IMAGING: I personally ordered and reviewed the following images  AP pelvis and x-rays of the left hip were obtained in clinic today.  No acute injuries are noted.  Mild to moderate loss of joint space within the left hip.  There are some osteophytes  over the lateral aspect of the acetabulum.  No identifiable cysts within the humeral head or the acetabulum.  Minimal subchondral sclerosis.  No evidence of AVN.  No bony lesions.  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     Oliver Barre, MD  01/10/2023 10:05 PM

## 2023-07-06 ENCOUNTER — Ambulatory Visit
Admission: EM | Admit: 2023-07-06 | Discharge: 2023-07-06 | Disposition: A | Discharge status: Home / Self Care | Attending: Nurse Practitioner | Admitting: Nurse Practitioner

## 2023-07-06 DIAGNOSIS — L720 Epidermal cyst: Secondary | ICD-10-CM

## 2023-07-06 MED ORDER — DOXYCYCLINE HYCLATE 100 MG PO TABS: 100.0000 mg | ORAL_TABLET | Freq: Two times a day (BID) | ORAL | 0 refills | Status: AC

## 2023-07-06 NOTE — ED Provider Notes (Signed)
 [facility]    CSN: [id number] Arrival date & time: 07/06/23  1522      History   Chief Complaint No chief complaint on file.   HPI [name] is a 78 y.o. female.   The history is provided by the patient.   Patient presents for complaints of a "boil" on her back has been present for the past 10 years.  Patient states approximately 2 to 3 days ago, her grandson squeezed on the area and "pus" came out of it.  She denies fever, chills, chest pain, abdominal pain, nausea, vomiting, or diarrhea.  Patient states that she thought she smelled foul-smelling drainage.  She reports the area has never bothered her in the past.  Past Medical History:  Diagnosis Date   Diabetes (HCC)    High cholesterol    Hypertension    Myocardial infarction (HCC) 2004.    Patient Active Problem List   Diagnosis Date Noted   CAD (coronary artery disease) 07/22/2016   Abnormal urinalysis 07/22/2016   Diastolic dysfunction 07/22/2016   Hypoglycemia 07/21/2016   HTN (hypertension) 03/04/2015   Diabetes mellitus (HCC) 03/04/2015   HLD (hyperlipidemia) 03/04/2015   Acute blood loss anemia 03/04/2015   GI bleeding 03/04/2015   Lower GI bleed 03/04/2015   GI bleed 03/04/2015   History of colonic polyps    Diverticulosis of colon with hemorrhage    Nausea alone 07/22/2012   Loss of weight 07/22/2012   Anemia 07/22/2012    Past Surgical History:  Procedure Laterality Date   ABDOMINAL HYSTERECTOMY     BACK SURGERY     CARDIAC CATHETERIZATION  2004   COLONOSCOPY  02/14/09   [id number] colon polyp/small internal hemorrhoids, benign polyp   COLONOSCOPY  02/14/2009   SLF:3-mm sessile sigmoid colon polyp/frequent ascending colon and diverticula/small internal hemorrhoids   COLONOSCOPY N/A 03/04/2015   Procedure: COLONOSCOPY;  Surgeon: Suzette Espy, MD;  Location: [facility];  Service: Endoscopy;  Laterality: N/A;   COLONOSCOPY WITH PROPOFOL  N/A 06/03/2022   Procedure:  COLONOSCOPY WITH PROPOFOL ;  Surgeon: Vinetta Greening, DO;  [facility]: [facility];  Service: Endoscopy;  Laterality: N/A;  10:15 am,asa 3   ESOPHAGOGASTRODUODENOSCOPY N/A 02/26/2013   Procedure: ESOPHAGOGASTRODUODENOSCOPY (EGD);  Surgeon: Alyce Jubilee, MD;  Location: [facility];  Service: Endoscopy;  Laterality: N/A;  9:30   NECK SURGERY     POLYPECTOMY  06/03/2022   Procedure: POLYPECTOMY INTESTINAL;  Surgeon: Vinetta Greening, DO;  Location: [facility];  Service: Endoscopy;;    OB History     Gravida      Para      Term      Preterm      AB      Living  1      SAB      IAB      Ectopic      Multiple      Live Births               Home Medications    Prior to Admission medications   Medication Sig Start Date End Date Taking? Authorizing Provider  doxycycline  (VIBRA -TABS) 100 MG tablet Take 1 tablet (100 mg total) by mouth 2 (two) times daily for 7 days. 07/06/23 07/13/23 Yes Leath-Warren, Belen Bowers, NP  acetaminophen  (TYLENOL ) 500 MG tablet Take 500 mg by mouth every 6 (six) hours as needed for moderate pain.    [provider]  amLODipine (NORVASC) 5  MG tablet Take 5 mg by mouth daily. 01/20/17   [provider]  aspirin  EC 81 MG tablet Take 1 tablet (81 mg total) by mouth daily. Restart on 03/12/15 03/05/15   Gwendalyn Lemma, MD  Carboxymethylcellulose Sodium (LUBRICANT EYE DROPS OP) Place 1 drop into both eyes daily as needed (dry eyes).    [provider]  HYDROcodone -acetaminophen  (NORCO/VICODIN) 5-325 MG tablet Take 1 tablet by mouth every 4 (four) hours as needed. 12/21/22   Ninetta Basket, MD  lisinopril  (ZESTRIL ) 20 MG tablet Take 20 mg by mouth daily. 03/14/21   [provider]  metFORMIN (GLUCOPHAGE) 1000 MG tablet Take 1,000 mg by mouth 2 (two) times daily with a meal.  02/04/15   [provider]  pioglitazone (ACTOS) 45 MG tablet Take 45 mg by mouth daily.  02/17/14   [provider]     Family History Family History  Problem Relation Age of Onset   Diabetes Mellitus II Mother    Lung cancer Father    Diabetes Mellitus II Sister    Diabetes Mellitus II Sister    Colon cancer Neg Hx     Social History Social History   Tobacco Use   Smoking status: Former    Current packs/day: 0.00    Types: Cigarettes    Quit date: 03/24/2005    Years since quitting: 18.2   Smokeless tobacco: Never  Vaping Use   Vaping status: Never Used  Substance Use Topics   Alcohol use: No    Alcohol/week: 0.0 standard drinks of alcohol   Drug use: No     Allergies   Atorvastatin    Review of Systems Review of Systems Per HPI  Physical Exam Triage Vital Signs ED Triage Vitals  Encounter Vitals Group     BP 07/06/23 1532 132/65     Systolic BP Percentile --      Diastolic BP Percentile --      Pulse Rate 07/06/23 1532 95     Resp 07/06/23 1532 20     Temp 07/06/23 1532 98.3 F (36.8 C)     Temp Source 07/06/23 1532 Oral     SpO2 07/06/23 1532 98 %     Weight --      Height --      Head Circumference --      Peak Flow --      Pain Score 07/06/23 1535 4     Pain Loc --      Pain Education --      Exclude from Growth Chart --    No data found.  Updated Vital Signs BP 132/65 (BP Location: Right Arm)   Pulse 95   Temp 98.3 F (36.8 C) (Oral)   Resp 20   SpO2 98%   Visual Acuity Right Eye Distance:   Left Eye Distance:   Bilateral Distance:    Right Eye Near:   Left Eye Near:    Bilateral Near:     Physical Exam Vitals and nursing note reviewed.  Constitutional:      General: She is not in acute distress.    Appearance: Normal appearance.  HENT:     Head: Normocephalic.  Eyes:     Extraocular Movements: Extraocular movements intact.     Pupils: Pupils are equal, round, and reactive to light.  Musculoskeletal:     Cervical back: Normal range of motion.  Skin:    General: Skin is warm and dry.  Neurological:     General: No focal  deficit present.     Mental Status: She is alert and oriented to person, place, and time.  Psychiatric:        Mood and Affect: Mood normal.        Behavior: Behavior normal.      UC Treatments / Results  Labs (all labs ordered are listed, but only abnormal results are displayed) Labs Reviewed - No data to display  EKG   Radiology No results found.  Procedures Procedures (including critical care time)  Medications Ordered in UC Medications - No data to display  Initial Impression / Assessment and Plan / UC Course  I have reviewed the triage vital signs and the nursing notes.  Pertinent labs & imaging results that were available during my care of the patient were reviewed by me and considered in my medical decision making (see chart for details).  Patient with epidermoid cyst to the mid lower back.  Cyst has been present for several years per the patient's report.  No obvious signs of infection present, however, patient will need to follow-up with PCP or dermatology for complete removal of the cyst sac.  Will treat for infection with doxycycline  100 mg twice daily.  Supportive care recommendations were provided and discussed with the patient to include warm compresses, cleansing the area with an antibacterial soap, and over-the-counter analgesics.  Patient was in agreement with this plan of care and verbalized understanding.  All questions were answered.  Patient stable for discharge.  Final Clinical Impressions(s) / UC Diagnoses   Final diagnoses:  Epidermoid cyst of skin of back     Discharge Instructions      Take medication as prescribed. You may take over-the-counter Tylenol  as needed for pain or discomfort. Apply warm compresses to the affected area at least 3-4 times daily while symptoms persist. Cleanse the area twice daily with an antibacterial soap such as Dial gold bar soap. Do not squeeze or disrupt the area. Please follow-up with your primary care physician  or with dermatology for further evaluation. Follow-up as needed.    ED Prescriptions     Medication Sig Dispense Auth. Provider   doxycycline  (VIBRA -TABS) 100 MG tablet Take 1 tablet (100 mg total) by mouth 2 (two) times daily for 7 days. 14 tablet Leath-Warren, Belen Bowers, NP      PDMP not reviewed this encounter.   Hardy Lia, NP 07/06/23 1556

## 2023-07-06 NOTE — ED Triage Notes (Signed)
 Pt reports boil on her lower back that has been on her back for "over 10 years" states she had her grandson look at if over the past week because she felt like it was leaking. He pressed on the boil and it started leaking "gray matter" x 2 days.

## 2023-07-06 NOTE — Discharge Instructions (Signed)
 Take medication as prescribed. You may take over-the-counter Tylenol  as needed for pain or discomfort. Apply warm compresses to the affected area at least 3-4 times daily while symptoms persist. Cleanse the area twice daily with an antibacterial soap such as Dial gold bar soap. Do not squeeze or disrupt the area. Please follow-up with your primary care physician or with dermatology for further evaluation. Follow-up as needed.

## 2023-09-11 ENCOUNTER — Other Ambulatory Visit (HOSPITAL_COMMUNITY): Payer: Self-pay | Admitting: Internal Medicine

## 2023-09-11 DIAGNOSIS — Z1231 Encounter for screening mammogram for malignant neoplasm of breast: Secondary | ICD-10-CM

## 2023-09-24 ENCOUNTER — Encounter (HOSPITAL_COMMUNITY): Payer: Self-pay

## 2023-09-24 ENCOUNTER — Ambulatory Visit (HOSPITAL_COMMUNITY)
Admission: RE | Admit: 2023-09-24 | Discharge: 2023-09-24 | Disposition: A | Discharge status: Home / Self Care | Source: Ambulatory Visit | Attending: Internal Medicine | Admitting: Internal Medicine

## 2023-09-24 DIAGNOSIS — Z1231 Encounter for screening mammogram for malignant neoplasm of breast: Secondary | ICD-10-CM | POA: Insufficient documentation
